# Patient Record
Sex: Female | Born: 1980 | Race: White | Hispanic: No | Marital: Married | Smoking: Never smoker
Health system: Southern US, Community
[De-identification: ages and names within clinical notes are randomized; demographics above are authoritative.]

## PROBLEM LIST (undated history)

## (undated) DIAGNOSIS — I639 Cerebral infarction, unspecified: Secondary | ICD-10-CM

## (undated) DIAGNOSIS — G459 Transient cerebral ischemic attack, unspecified: Secondary | ICD-10-CM

## (undated) DIAGNOSIS — F419 Anxiety disorder, unspecified: Secondary | ICD-10-CM

## (undated) HISTORY — PX: DILATION AND CURETTAGE OF UTERUS: SHX78

## (undated) HISTORY — DX: Anxiety disorder, unspecified: F41.9

---

## 1998-06-01 ENCOUNTER — Encounter: Admission: RE | Admit: 1998-06-01 | Discharge: 1998-06-01 | Payer: Self-pay | Admitting: Family Medicine

## 1998-09-15 ENCOUNTER — Encounter: Admission: RE | Admit: 1998-09-15 | Discharge: 1998-09-15 | Payer: Self-pay | Admitting: Family Medicine

## 1999-07-12 ENCOUNTER — Encounter: Payer: Self-pay | Admitting: Sports Medicine

## 1999-07-12 ENCOUNTER — Encounter: Admission: RE | Admit: 1999-07-12 | Discharge: 1999-07-12 | Payer: Self-pay | Admitting: Sports Medicine

## 1999-08-11 ENCOUNTER — Encounter: Admission: RE | Admit: 1999-08-11 | Discharge: 1999-08-11 | Payer: Self-pay | Admitting: Family Medicine

## 1999-11-09 ENCOUNTER — Encounter: Admission: RE | Admit: 1999-11-09 | Discharge: 1999-11-09 | Payer: Self-pay | Admitting: Family Medicine

## 1999-11-16 ENCOUNTER — Inpatient Hospital Stay (HOSPITAL_COMMUNITY): Admission: EM | Admit: 1999-11-16 | Discharge: 1999-11-16 | Payer: Self-pay | Admitting: Obstetrics and Gynecology

## 2001-02-22 ENCOUNTER — Other Ambulatory Visit: Admission: RE | Admit: 2001-02-22 | Discharge: 2001-02-22 | Payer: Self-pay | Admitting: Obstetrics and Gynecology

## 2001-02-24 ENCOUNTER — Encounter: Payer: Self-pay | Admitting: Obstetrics and Gynecology

## 2001-02-25 ENCOUNTER — Ambulatory Visit (HOSPITAL_COMMUNITY): Admission: AD | Admit: 2001-02-25 | Discharge: 2001-02-25 | Payer: Self-pay | Admitting: Obstetrics and Gynecology

## 2001-02-25 ENCOUNTER — Encounter (INDEPENDENT_AMBULATORY_CARE_PROVIDER_SITE_OTHER): Payer: Self-pay | Admitting: Specialist

## 2001-02-28 ENCOUNTER — Ambulatory Visit: Admission: AD | Admit: 2001-02-28 | Discharge: 2001-02-28 | Payer: Self-pay | Admitting: Obstetrics and Gynecology

## 2001-02-28 ENCOUNTER — Encounter (INDEPENDENT_AMBULATORY_CARE_PROVIDER_SITE_OTHER): Payer: Self-pay | Admitting: Specialist

## 2001-06-10 ENCOUNTER — Encounter: Admission: RE | Admit: 2001-06-10 | Discharge: 2001-06-10 | Payer: Self-pay | Admitting: Family Medicine

## 2001-09-25 ENCOUNTER — Encounter: Admission: RE | Admit: 2001-09-25 | Discharge: 2001-09-25 | Payer: Self-pay | Admitting: Family Medicine

## 2002-01-14 ENCOUNTER — Other Ambulatory Visit: Admission: RE | Admit: 2002-01-14 | Discharge: 2002-01-14 | Payer: Self-pay | Admitting: Obstetrics and Gynecology

## 2002-06-18 ENCOUNTER — Inpatient Hospital Stay (HOSPITAL_COMMUNITY): Admission: AD | Admit: 2002-06-18 | Discharge: 2002-06-18 | Payer: Self-pay | Admitting: Obstetrics and Gynecology

## 2002-06-18 ENCOUNTER — Encounter: Payer: Self-pay | Admitting: Obstetrics and Gynecology

## 2002-07-11 ENCOUNTER — Inpatient Hospital Stay (HOSPITAL_COMMUNITY): Admission: AD | Admit: 2002-07-11 | Discharge: 2002-07-11 | Payer: Self-pay | Admitting: Obstetrics and Gynecology

## 2002-07-20 ENCOUNTER — Inpatient Hospital Stay (HOSPITAL_COMMUNITY): Admission: AD | Admit: 2002-07-20 | Discharge: 2002-07-20 | Payer: Self-pay | Admitting: Obstetrics and Gynecology

## 2002-07-21 ENCOUNTER — Inpatient Hospital Stay (HOSPITAL_COMMUNITY): Admission: AD | Admit: 2002-07-21 | Discharge: 2002-07-24 | Payer: Self-pay | Admitting: Obstetrics and Gynecology

## 2002-07-22 ENCOUNTER — Encounter (INDEPENDENT_AMBULATORY_CARE_PROVIDER_SITE_OTHER): Payer: Self-pay

## 2002-09-05 ENCOUNTER — Other Ambulatory Visit: Admission: RE | Admit: 2002-09-05 | Discharge: 2002-09-05 | Payer: Self-pay | Admitting: Obstetrics and Gynecology

## 2003-09-23 ENCOUNTER — Other Ambulatory Visit: Admission: RE | Admit: 2003-09-23 | Discharge: 2003-09-23 | Payer: Self-pay | Admitting: Obstetrics and Gynecology

## 2004-10-19 ENCOUNTER — Other Ambulatory Visit: Admission: RE | Admit: 2004-10-19 | Discharge: 2004-10-19 | Payer: Self-pay | Admitting: Obstetrics and Gynecology

## 2006-01-19 ENCOUNTER — Ambulatory Visit (HOSPITAL_COMMUNITY): Admission: RE | Admit: 2006-01-19 | Discharge: 2006-01-19 | Payer: Self-pay | Admitting: Obstetrics and Gynecology

## 2010-02-07 ENCOUNTER — Emergency Department (HOSPITAL_COMMUNITY): Admission: EM | Admit: 2010-02-07 | Discharge: 2010-02-07 | Payer: Self-pay | Admitting: Emergency Medicine

## 2010-02-07 DIAGNOSIS — G459 Transient cerebral ischemic attack, unspecified: Secondary | ICD-10-CM

## 2010-02-07 HISTORY — DX: Transient cerebral ischemic attack, unspecified: G45.9

## 2010-02-09 ENCOUNTER — Encounter: Admission: RE | Admit: 2010-02-09 | Discharge: 2010-02-09 | Payer: Self-pay | Admitting: Family Medicine

## 2010-08-21 ENCOUNTER — Encounter: Payer: Self-pay | Admitting: Obstetrics and Gynecology

## 2010-10-16 LAB — CBC
HCT: 37.6 % (ref 36.0–46.0)
MCHC: 34 g/dL (ref 30.0–36.0)
MCV: 82.2 fL (ref 78.0–100.0)
Platelets: 342 10*3/uL (ref 150–400)
RDW: 16.5 % — ABNORMAL HIGH (ref 11.5–15.5)

## 2010-10-16 LAB — BASIC METABOLIC PANEL
BUN: 10 mg/dL (ref 6–23)
Chloride: 107 mEq/L (ref 96–112)
Creatinine, Ser: 0.62 mg/dL (ref 0.4–1.2)
Glucose, Bld: 113 mg/dL — ABNORMAL HIGH (ref 70–99)
Potassium: 3.9 mEq/L (ref 3.5–5.1)

## 2010-10-16 LAB — DIFFERENTIAL
Basophils Absolute: 0 10*3/uL (ref 0.0–0.1)
Basophils Relative: 0 % (ref 0–1)
Eosinophils Absolute: 0 10*3/uL (ref 0.0–0.7)
Eosinophils Relative: 0 % (ref 0–5)
Monocytes Absolute: 0.4 10*3/uL (ref 0.1–1.0)

## 2010-12-16 NOTE — Discharge Summary (Signed)
NAME:  [name], [name]                        ACCOUNT NO.:  [id number]   MEDICAL RECORD NO.:  [id number]                   PATIENT TYPE:  INP   LOCATION:  9119                                 FACILITY:  [facility]   PHYSICIAN:  Tracie Harrier, M.D.              DATE OF BIRTH:  08/03/1980   DATE OF ADMISSION:  07/21/2002  DATE OF DISCHARGE:  07/24/2002                                 DISCHARGE SUMMARY   ADMITTING DIAGNOSES:  1. Intrauterine pregnancy at 24 weeks estimated gestational age.  2. Pregnancy-induced hypertension.   DISCHARGE DIAGNOSES:  1. Status post low transverse cesarean section.  2. Viable female infant.   PROCEDURE:  Primary low transverse cesarean section.   REASON FOR ADMISSION:  Please see written H&P.   HOSPITAL COURSE:  The patient is a 30 year old white female gravida 2 para 0  at 81 weeks estimated gestational age who was admitted to [facility] for a two-stage induction for mild proteinuria and elevated blood  pressure.  PIH labs were within normal limits.  The patient was given  Cytotec on the evening of admission.  On the following a.m., Pitocin was  started.  She was noted to have decelerations on the monitor to the 80s,  lasting two to three minutes, which resolved with position change and oxygen  administration.  Artificial rupture of membranes was performed revealing  clear fluid.  Intrauterine pressure catheter and fetal scalp electrode were  placed.  Fetal heart tones continued to be reactive with intermittent sharp  decelerations.  Amnioinfusion was instituted.  The baby continued to have  decelerations to the 80s.  Cervix was noted to be 3 cm dilated with a vertex  presentation at a -2 station.  Decision was made to proceed with a cesarean  delivery.  The patient was transferred to the operating room where epidural  anesthesia was augmented to a surgical level.  A low transverse incision was  made with the delivery of a viable female  infant weighing 8 pounds 0 ounces  with Apgars of 9 at one minute, 9 at five minutes.  Arterial cord pH was  7.30.  The patient tolerated the procedure well and was taken to the  recovery room in stable condition.  On postoperative day #1 the patient had  good return of bowel function.  Abdomen was soft and nontender.  Abdominal  dressing was clean, dry, and intact.  The patient was tolerating a regular  diet without complaints of nausea and vomiting.  On postoperative day #2 the  patient was doing well.  She was ambulating without assistance.  Abdomen was  soft.  Fundus was firm and nontender.  Incision was clean, dry, and intact.  Labs revealed hemoglobin of 9.6; platelet count of 245,000; wbc count of  13.0.  The patient was discharged home.   CONDITION ON DISCHARGE:  Good.   DIET:  Regular as tolerated.  ACTIVITY:  No heavy lifting, no driving x2 weeks, no vaginal entry.   FOLLOW-UP:  She is to follow up in the office in two to three days for  staple removal, then she is to return to the office in one to two weeks for  an incision check.  She is to call for a temperature greater than 100  degrees, persistent nausea and vomiting, heavy vaginal bleeding, and/or  redness or drainage from the incisional site.    DISCHARGE MEDICATIONS:  1. Percocet 5/325 #30 one p.o. q.4-6h. p.r.n. pain.  2. Motrin 600 mg q.6h. p.r.n.  3. Prenatal vitamins one p.o. daily.  4. Colace one p.o. daily p.r.n.     Julio Sicks, N.P.                        Tracie Harrier, M.D.    CC/MEDQ  D:  08/27/2002  T:  08/27/2002  Job:  [id number]

## 2010-12-16 NOTE — Op Note (Signed)
NAME:  [name], [name]                        ACCOUNT NO.:  [id number]   MEDICAL RECORD NO.:  [id number]                   PATIENT TYPE:  INP   LOCATION:  9119                                 FACILITY:  [facility]   PHYSICIAN:  Guy Sandifer. Arleta Creek, M.D.           DATE OF BIRTH:  [birth year]   DATE OF PROCEDURE:  07/22/2002  DATE OF DISCHARGE:                                 OPERATIVE REPORT   PREOPERATIVE DIAGNOSES:  1. Intrauterine pregnancy at 59 weeks estimated gestational age.  2. Nonreassuring fetal heart tracing.   POSTOPERATIVE DIAGNOSES:  1. Intrauterine pregnancy at 61 weeks estimated gestational age.  2. Nonreassuring fetal heart tracing.   PROCEDURE:  Low transverse cesarean section.   SURGEON:  Guy Sandifer. Henderson Cloud, M.D.   ANESTHESIA:  Epidural, Quillian Quince, M.D.   ESTIMATED BLOOD LOSS:  1000 cc.   FINDINGS:  Viable female infant.  Apgars of 9 and 9 at one and five minutes,  respectively.  Arterial cord pH 7.30.  Birth weight 8 pounds 0 ounces.   SPECIMENS:  Placenta.   INDICATIONS AND CONSENT:  This patient is a 30 year old white female G2, P0  with an EDC of July 26, 2002 who was admitted to the hospital for  induction yesterday secondary to a mild proteinuria and blood pressure  elevations with normal PIH laboratories as well as prolonged prodromal  labor.  The patient was given Cytotec followed by Pitocin this morning.  She  was noted to have a deceleration to the 80s lasting two or three minutes  which resolved with position change and oxygen.  Artificial rupture of  membranes was carried out for clear fluid.  IUPC and fetal scalp electrode  was placed.  Fetal heart tones continued to be reactive with intermittent  sharp decelerations.  Amnioinfusion was instituted.  Baby had a deceleration  down to the 80s or so which lasted for two or three minutes and resolved.  She then continued to have intermittent decelerations.  Cervix was 3 cm, -2  station.  Due to  the continued periods of fetal heart rate deceleration and  anticipation of a long labor, cesarean section was recommended.  Potential  risks and complications were discussed with the patient and her husband  preoperatively including, but not limited to, infection, organ damage,  bleeding requiring transfusion of blood products with possible HIV and  hepatitis acquisition, DVT, PE, and pneumonia.  All questions were answered  and consent is signed on the chart.   PROCEDURE:  The patient is taken to the operating room where epidural is  augmented to a surgical level.  She is prepped and draped in the sterile  fashion.  Foley catheter is already in place.  After testing for adequate  epidural anesthesia, skin is entered through a Pfannenstiel incision and  dissection is carried out in layers to the peritoneum.  Peritoneum is  incised and extended superiorly and inferiorly.  The vesicouterine  peritoneum is taken down cephalolaterally.  The bladder flap is developed  and the bladder blade was placed.  Uterus is incised in a low transverse  manner and the uterine cavity is entered bluntly with a hemostat.  The  uterine incision is extended cephalolaterally with the fingers.  Vertex is  delivered and noted to be in the occiput posterior position.  Nuchal cord x2  was reduced.  Oro and nasopharynx were suctioned.  The remainder of the  infant is delivered.  Good cry and tone is noted.  Infant is handed to the  waiting pediatrics team.  Placenta is manually delivered, sent to pathology.  The internal uterine contour is normal and clean.  Uterus is somewhat boggy  at first, but responds to 40 units of Pitocin in the initial bag of IV  fluids.  Uterus is closed in a running locking fashion with 0 Monocryl  suture which achieves good hemostasis.  Tubes and ovaries are normal  bilaterally.  Anterior peritoneum is closed in a running fashion with 0  Monocryl suture which is also used to reapproximate  the pyramidalis muscle  in the midline.  Anterior rectus fascia is closed in a running fashion with  0 PDS suture and the skin is closed with clips.  All sponge, instrument, and  needle counts are correct and the patient is transferred to the recovery  room in stable condition.                                               Guy Sandifer Arleta Creek, M.D.    JET/MEDQ  D:  07/22/2002  T:  07/22/2002  Job:  [id number]

## 2010-12-16 NOTE — Op Note (Signed)
[facility]  Patient:    [name], [name]                     MRN: [id number] Proc. Date: 02/28/01 Adm. Date:  [id number] Attending:  Cordelia Pen Ii                           Operative Report  PREOPERATIVE DIAGNOSIS:       Hematometra status post dilatation and evacuation on February 25, 2001.  POSTOPERATIVE DIAGNOSIS:      Hematometra status post dilatation and evacuation on February 25, 2001.  OPERATION:                    Dilatation and evacuation.  SURGEON:                      Nira Retort, P.A.  ANESTHESIA:                   MAC with 1% Xylocaine paracervical block.  ANESTHESIOLOGIST:             Raul Del, M.D.  ESTIMATED BLOOD LOSS:         Less than 100 cc.  INDICATIONS AND CONSENT:      The patient is a 30 year old married white female, G1, SAB1 status post D&E for a miscarriage on February 25, 2001.  She has had intermittent bleeding and cramping since that time.  Today she had markedly increased pain with heavy pelvic cramping.  She has also had increasing bleeding, passing large clots.  No fever, no nausea or vomiting. She had a normal bowel movement today.  Evaluation in triage revealed temperature to be 99.1 with stable vital signs.  Cervix was fingertip dilated, and the uterus was 8 to 10 weeks in size.  The adnexa were without masses. Hemoglobin is 11.6, and white count is 10.9.  Blood type is A positive. Diagnosis of hematometra was made.  Recommendation for dilatation and evacuation was discussed with the patient and her husband.  Potential risks and complications were discussed including but not limited to infection, uterine perforation, organ damage, bleeding requiring transfusion of blood products with possible transfusion reaction, HIV and hepatitis acquisition, DVT, PE, pneumonia, hysterectomy, and synechia formation.  All questions were answered and consent is signed and on the chart.  DESCRIPTION OF  PROCEDURE:     The patient is taken to the operating room and placed in the dorsolithotomy position where IV anesthetic is given.  She is then placed in the dorsolithotomy position where she is prepped, bladder straight catheterized,  and she is draped in a sterile fashion.  Bivalve speculum is placed in the vagina, and anterior cervical lip is injected with 1% Xylocaine.  The cervix is then grasped with a single-tooth tenaculum. Paracervical block is then placed at 2, 4, 5, 7, 8, and 10 oclock positions with 1% Xylocaine.  The cervix is gently dilated to a 33 Pratt dilator.  A #7 curved curet is then placed in the uterine cavity, and suction curettage is carried out.   Alternating sharp and suction curettage is carried out until the cavity is clean.  Excellent hemostasis is noted.  Ten units of Pitocin are added to the remaining 500 cc of IV fluids after initial pass of the curet. The patient is given IV clindamycin during the case.  Blood type is A positive.  All  counts are correct.  The patient is awakened and taken to the recovery room in stable condition.  She will be discharged home on Methergine 0.9 mg p.o. t.i.d. for 3 days, a Zithromax pack, and follow up in the office in four to five days. DD:  02/28/01 TD:  03/01/01 Job: [id number]

## 2010-12-16 NOTE — Op Note (Signed)
[facility]  Patient:    [name], [name]                       MRN: [id number] Proc. Date: 02/25/01 Attending:  Freddy Finner, M.D.                           Operative Report  SS#:                          [id number]  PREOPERATIVE DIAGNOSIS:       Missed abortion.  POSTOPERATIVE DIAGNOSIS:      Missed abortion.  OPERATION:                    Dilatation and evacuation.  SURGEON:                      Freddy Finner, M.D.  ANESTHESIA:                   Managed intravenous sedation and paracervical                               block.  INTRAOPERATIVE COMPLICATIONS: None.  ESTIMATED INTRAOPERATIVE BLOOD LOSS:  Less than or equal to 50 cc.  INDICATIONS:                  The patient is a 30 year old married white female primigravida who was seen in the office last week and found to have a nonviable intrauterine pregnancy by pelvic ultrasound.  She is admitted now for a D&E.  She was admitted on the morning of surgery.  Her blood type is known to be A-positive.  DESCRIPTION OF PROCEDURE:     She was brought to the operating room and placed under adequate intravenous sedation.  She was placed in the dorsal lithotomy position.  A Betadine prep of the perineum and the vagina was carried out.  A bivalved speculum was introduced.  A paracervical block was placed using 10 cc of 1% Xylocaine.  The cervix was progressively dilated to 27 with Pratts.  An 8 mm suction cannula was introduced, and a vacuum aspiration confirmed products of conception.  This was continued until it was felt that the cavity was evacuated.  A gentle sharp curettage and expiration with Randall stone forceps and repeat vacuum aspiration confirmed the complete evacuation of the cavity.  The procedure was terminated.  The patient was taken to the recovery room in good condition.  DISPOSITION:                  She will be given routine outpatient surgical instructions, and will be  followed up in approximately one week in the office. DD:  02/25/01 TD:  02/25/01 Job: [id number]

## 2010-12-19 ENCOUNTER — Inpatient Hospital Stay (HOSPITAL_COMMUNITY)
Admission: AD | Admit: 2010-12-19 | Discharge: 2010-12-21 | DRG: 371 | Disposition: A | Discharge status: Home / Self Care | Payer: BC Managed Care – PPO | Source: Ambulatory Visit | Attending: Obstetrics and Gynecology | Admitting: Obstetrics and Gynecology

## 2010-12-19 DIAGNOSIS — R21 Rash and other nonspecific skin eruption: Secondary | ICD-10-CM | POA: Diagnosis not present

## 2010-12-19 DIAGNOSIS — O99892 Other specified diseases and conditions complicating childbirth: Secondary | ICD-10-CM | POA: Diagnosis present

## 2010-12-19 DIAGNOSIS — Z22330 Carrier of Group B streptococcus: Secondary | ICD-10-CM

## 2010-12-19 DIAGNOSIS — O34219 Maternal care for unspecified type scar from previous cesarean delivery: Principal | ICD-10-CM | POA: Diagnosis present

## 2010-12-19 DIAGNOSIS — O99893 Other specified diseases and conditions complicating puerperium: Secondary | ICD-10-CM | POA: Diagnosis not present

## 2010-12-19 LAB — URINALYSIS, ROUTINE W REFLEX MICROSCOPIC
Nitrite: NEGATIVE
Specific Gravity, Urine: 1.02 (ref 1.005–1.030)
Urobilinogen, UA: 0.2 mg/dL (ref 0.0–1.0)
pH: 6.5 (ref 5.0–8.0)

## 2010-12-19 LAB — COMPREHENSIVE METABOLIC PANEL
ALT: 16 U/L (ref 0–35)
Albumin: 2.6 g/dL — ABNORMAL LOW (ref 3.5–5.2)
Calcium: 9.7 mg/dL (ref 8.4–10.5)
GFR calc Af Amer: >60 mL/min (ref >60)
Glucose, Bld: 103 mg/dL — ABNORMAL HIGH (ref 70–99)
Sodium: 131 mEq/L — ABNORMAL LOW (ref 135–145)
Total Protein: 6.3 g/dL (ref 6.0–8.3)

## 2010-12-19 LAB — RPR: RPR Ser Ql: NONREACTIVE

## 2010-12-19 LAB — CBC
MCHC: 33.8 g/dL (ref 30.0–36.0)
Platelets: 232 10*3/uL (ref 150–400)
RDW: 13.7 % (ref 11.5–15.5)
WBC: 10.2 10*3/uL (ref 4.0–10.5)

## 2010-12-19 LAB — URINE MICROSCOPIC-ADD ON

## 2010-12-19 LAB — LACTATE DEHYDROGENASE: LDH: 183 U/L (ref 94–250)

## 2010-12-20 LAB — CBC
HCT: 35.2 % — ABNORMAL LOW (ref 36.0–46.0)
MCHC: 32.4 g/dL (ref 30.0–36.0)
MCV: 90 fL (ref 78.0–100.0)
RDW: 14 % (ref 11.5–15.5)

## 2010-12-30 ENCOUNTER — Other Ambulatory Visit (HOSPITAL_COMMUNITY): Payer: BC Managed Care – PPO

## 2010-12-30 ENCOUNTER — Inpatient Hospital Stay (HOSPITAL_COMMUNITY): Admission: AD | Admit: 2010-12-30 | Payer: Self-pay | Admitting: Obstetrics and Gynecology

## 2010-12-30 NOTE — Discharge Summary (Signed)
[name], [name] NO.:  [id number]  MEDICAL RECORD NO.:  [id number]           PATIENT TYPE:  I  LOCATION:  9101                          FACILITY:  [facility]  PHYSICIAN:  Lenoard Aden, M.D.DATE OF BIRTH:  1981-04-16  DATE OF ADMISSION:  12/19/2010 DATE OF DISCHARGE:  12/21/2010                              DISCHARGE SUMMARY   ADMITTING DIAGNOSES:  A [redacted] weeks gestation, spontaneous rupture of membranes, previous cesarean section, desires repeat.  DISCHARGE DIAGNOSES:  Postoperative day #2; status post cesarean section, allergic reaction with dermatitis.  HISTORY:  The patient is a 30 year old gravida 3, para 1-0-0-1 at [redacted] weeks gestation with an [facility] of January 09, 2011.  Prenatal care at [facility] since 8 weeks with Dr. Billy Coast as primary.  PRENATAL LABORATORY DATA:  Blood type and Rh is A positive, antibody negative, rubella positive, GBS positive, HIV negative, RPR negative, hepatitis B negative and a normal GTT screening.  Prenatal course was complicated by history of cesarean section with gestational hypertension. No evidence of gestational hypertension in this pregnancy.  MEDICAL SURGICAL HISTORY:  Significant for cesarean section in 2003 for gestational hypertension and failure to progress in labor.  ALLERGY:  PENICILLIN with hives, ERYTHROMYCIN with GI upset.  CURRENT MEDICATIONS:  Prenatal vitamin 1 tablet daily and Zofran as needed for nausea.  PRESENTATION:  The patient presents at [redacted] weeks gestation with spontaneous rupture of membranes and onset of labor.  The patient declines trial of labor after cesarean section and elects for repeat C- section.  Admission CBC, white blood cell count 10.2, hemoglobin 12.6, hematocrit 37.3, and a platelet count 232.  Uric acid of 3.8, SGOT and SGPT are within normal limits.  Normal PIH screening.  PROCEDURE:  The patient undergoes cesarean section by Dr. Billy Coast with a delivery of a female, Apgar scores of 8 and  9, newborn is transferred to the regular nursery.  Postoperative care.    POSTOPERATIVE COURSE:  The patient's postoperative course was complicated by mild-moderate allergic reaction presumed medication in etiology. Patient has no SOB, only dermatitis reaction. The patient received Ancef in the OR perioperatively and Percocet postpartum.  Onset of rash on postop day #1 with increasing rash on postoperative day 2. Patient reports increase in symptoms with each dose of Percocet, unknown agent of etiology for allergic reaction so Percocet discontinued. The patient  initiated on therapy for medication reaction with benadryl followed by Atarax, zantac 150 mg daily. Postoperative CBC, white blood cell count 9.9, hemoglobin 11.4, hematocrit 35.2, and a platelet count 225.  Vital signs were stable.  The patient remains afebrile during the hospitalization.  Blood pressures stable and no evidence of preeclampsia.  No symptoms of PIH.  Physical exam notes marked erythema with enlarging rash. Rash initiated on abdomen, periumbilical with generalized pruritus and expansion of rash on postoperative day 2 to two-thirds of the abdomen, buttocks, back, and thigh.  The wound edges are well approximated with staples.  There is no erythema, no ecchymosis or drainage from the wound sites, only erythema related to the rash.  The patient is discharged home on postoperative day #2 in  stable condition.  DISCHARGE INSTRUCTIONS:  Instructions per [facility] booklet.  ACTIVITY:  Postoperative restrictions x2 weeks.  DIET:  Regular.  Incision is intact with staples with removal on Dec 23, 2010, at [facility].  MEDICATIONS AT THE TIME OF DISCHARGE: 1. Prenatal vitamin 1 tablet daily. 2. Ibuprofen 800 mg every 8 hours as needed for discomfort. 3. Ultram 50 mg p.o. q.6 h. as needed for pain. 4. Zantac 150 mg p.o. daily. 5. Atarax 25 mg q.6 h. as needed for itching and irritation. 6. Triamcinolone 0.1% steroid cream applied to  rash twice daily as     needed.  Reevaluation of allergic response at postoperative visit on Dec 23, 2010, with staple removal with consideration of a Medrol pack if any worsening in her condition.     Marlinda Mike, C.N.M.   ______________________________ Lenoard Aden, M.D.    TB/MEDQ  D:  12/22/2010  T:  12/23/2010  Job:  [id number]  Electronically Signed by Marlinda Mike C.N.M. on 12/27/2010 11:05:57 AM Electronically Signed by Olivia Mackie M.D. on 12/30/2010 02:01:53 PM

## 2010-12-30 NOTE — Op Note (Signed)
  [name], [name] NO.:  [id number]  MEDICAL RECORD NO.:  [id number]           PATIENT TYPE:  I  LOCATION:  9101                          FACILITY:  [facility]  PHYSICIAN:  Lenoard Aden, M.D.DATE OF BIRTH:  10/10/1980  DATE OF PROCEDURE:  12/19/2010 DATE OF DISCHARGE:                              OPERATIVE REPORT   PREOPERATIVE DIAGNOSES: 1. Previous C-section. 2. A 37 weeks with spontaneous rupture of membranes. 3. GBS positive.  POSTOPERATIVE DIAGNOSES: 1. Previous C-section. 2. A 37 weeks with spontaneous rupture of membranes. 3. GBS positive.  PROCEDURE:  Repeat low segment transverse cesarean section.  SURGEON:  Lenoard Aden, MD  ASSISTANT:  Marlinda Mike, CNM  ANESTHESIA:  Spinal by Dr. Arby Barrette.  ESTIMATED BLOOD LOSS:  1000 mL.  COMPLICATIONS:  None.  DRAINS:  Foley.  COUNTS:  Correct.  DISPOSITION:  To recovery in good condition.  FINDINGS:  Full-term living female, occiput anterior positioning, Apgar's of 8 and 9.  Pediatricians attendance.  Normal tubes, normal ovaries.  Normal uterine cavity.  Two-layer uterine closure.  BRIEF OPERATIVE NOTE:  Noted to being apprised of risks of anesthesia, infection, bleeding, injury to abdominal organs, need for repair, delayed versus immediate complications to include bowel and bladder injury, possible need for repair, the patient was to brought to the operating room and administered spinal anesthetic without complications, prepped and draped in usual sterile fashion.  Foley catheter was placed after achieving adequate anesthesia, dilute Marcaine solution was placed.  Pfannenstiel skin incision was made with scalpel, carried down to fascia which was nicked in midline and opened transversely using Mayo scissors.  Rectus muscles were dissected sharply in the midline. Peritoneum was entered sharply.  Bladder blade was placed.  Visceral peritoneum scored sharply off the lower uterine segment.   Kerr hysterotomy incision was made.  Atraumatic delivery full-term living female handed to pediatricians in attendance.  Apgar's of 8 and 9.  Cord blood collected.  Placenta delivered manually intact, three-vessel cord. Uterus exteriorized, curetted using a dry lap pack and closed in 2 running imbricating layers of 0 Monocryl suture and interrupted sutures to the right lateral and midline are placed for further hemostasis. Bladder flap inspected and found to be hemostatic.  Irrigation accomplished.  Uterus was placed in the abdominal cavity and reinspected.  Urine output was excellent.  Urine was clear.  After achieving good hemostasis, the fascia was reapproximated using 0 Monocryl in running fashion.  Subcutaneous tissue reapproximated using a 2-0 plain in a continued running fashion.  Skin was closed using staples.  The patient tolerated the procedure well and was transferred to recovery in good condition.     Lenoard Aden, M.D.     RJT/MEDQ  D:  12/19/2010  T:  12/19/2010  Job:  [id number]  Electronically Signed by Olivia Mackie M.D. on 12/30/2010 02:01:51 PM

## 2011-01-05 ENCOUNTER — Inpatient Hospital Stay (HOSPITAL_COMMUNITY)
Admission: RE | Admit: 2011-01-05 | Payer: BC Managed Care – PPO | Source: Ambulatory Visit | Admitting: Obstetrics and Gynecology

## 2011-08-01 DIAGNOSIS — F419 Anxiety disorder, unspecified: Secondary | ICD-10-CM

## 2011-08-01 HISTORY — DX: Anxiety disorder, unspecified: F41.9

## 2014-01-08 ENCOUNTER — Encounter (HOSPITAL_COMMUNITY): Payer: Self-pay | Admitting: Emergency Medicine

## 2014-01-08 DIAGNOSIS — R42 Dizziness and giddiness: Secondary | ICD-10-CM | POA: Insufficient documentation

## 2014-01-08 DIAGNOSIS — R209 Unspecified disturbances of skin sensation: Secondary | ICD-10-CM | POA: Insufficient documentation

## 2014-01-08 DIAGNOSIS — Z8673 Personal history of transient ischemic attack (TIA), and cerebral infarction without residual deficits: Secondary | ICD-10-CM | POA: Insufficient documentation

## 2014-01-08 DIAGNOSIS — Z880 Allergy status to penicillin: Secondary | ICD-10-CM | POA: Insufficient documentation

## 2014-01-08 NOTE — ED Notes (Signed)
The patient said she felt "swimmy headed" and her left arm felt numb.  Everything has resolved in the drive here but she still wanted to get checked out.  The patient and her husband denies any slurred speech, drooping of the face or any other symptoms.  The numbness and the "swimmy headedness" resolved on the drive here.  The patient was diagnosed with a TIA on 02/07/2010.  She has had several tests done but nothing has shown.  She is complaining of neck pain 3/10.

## 2014-01-09 ENCOUNTER — Emergency Department (HOSPITAL_COMMUNITY)
Admission: EM | Admit: 2014-01-09 | Discharge: 2014-01-09 | Disposition: A | Discharge status: Home / Self Care | Payer: 59 | Attending: Emergency Medicine | Admitting: Emergency Medicine

## 2014-01-09 DIAGNOSIS — R202 Paresthesia of skin: Secondary | ICD-10-CM

## 2014-01-09 HISTORY — DX: Transient cerebral ischemic attack, unspecified: G45.9

## 2014-01-09 NOTE — Discharge Instructions (Signed)
Hyperventilation Hyperventilation is breathing that is deeper and more rapid than normal. It is usually associated with panic and anxiety. Hyperventilation can make you feel breathless. It is sometimes called overbreathing. Breathing out too much causes a decrease in the amount of carbon dioxide gas in the blood. This leads to tingling and numbness in the hands, feet, and around the mouth. If this continues, your fingers, hands, and toes may begin to spasm. Hyperventilation usually lasts 20 30 minutes and can be associated with other symptoms of panic and anxiety, including:   Chest pains or tightness.  A pounding or irregular, racing heartbeat (palpitations).  Dizziness.  Lightheadedness.  Dry mouth.  Weakness.  Confusion.  Sleep disturbance. CAUSES  Sudden onset (acute) hyperventilation is usually triggered by acute stress, anxiety, or emotional upset. Long-term (chronic) and recurring hyperventilation can occur with chronic lung problems, such emphysema or asthma. Other causes include:   Nervousness.  Stress.  Stimulant, drug, or alcohol use.  Lung disease.  Infections, such as pneumonia.  Heart problems.  Severe pain.  Waking from a bad dream.  Pregnancy.  Bleeding. HOME CARE INSTRUCTIONS  Learn and use breathing exercises that help you breathe from your diaphragm and abdomen.  Practice relaxation techniques to reduce stress, such as visualization, meditation, and muscle release.  During an attack, try breathing into a paper bag. This changes the carbon dioxide level and slows down breathing. SEEK IMMEDIATE MEDICAL CARE IF:  Your hyperventilation continues or gets worse. MAKE SURE YOU:  Understand these instructions.  Will watch your condition.  Will get help right away if you are not doing well or get worse. Document Released: 07/14/2000 Document Revised: 01/16/2012 Document Reviewed: 10/26/2011 [facility] Patient Information 2014 [place],  [place].  Paresthesia Paresthesia is an abnormal burning or prickling sensation. This sensation is generally felt in the hands, arms, legs, or feet. However, it may occur in any part of the body. It is usually not painful. The feeling may be described as:  Tingling or numbness.  "Pins and needles."  Skin crawling.  Buzzing.  Limbs "falling asleep."  Itching. Most people experience temporary (transient) paresthesia at some time in their lives. CAUSES  Paresthesia may occur when you breathe too quickly (hyperventilation). It can also occur without any apparent cause. Commonly, paresthesia occurs when pressure is placed on a nerve. The feeling quickly goes away once the pressure is removed. For some people, however, paresthesia is a long-lasting (chronic) condition caused by an underlying disorder. The underlying disorder may be:  A traumatic, direct injury to nerves. Examples include a:  Broken (fractured) neck.  Fractured skull.  A disorder affecting the brain and spinal cord (central nervous system). Examples include:  Transverse myelitis.  Encephalitis.  Transient ischemic attack.  Multiple sclerosis.  Stroke.  Tumor or blood vessel problems, such as an arteriovenous malformation pressing against the brain or spinal cord.  A condition that damages the peripheral nerves (peripheral neuropathy). Peripheral nerves are not part of the brain and spinal cord. These conditions include:  Diabetes.  Peripheral vascular disease.  Nerve entrapment syndromes, such as carpal tunnel syndrome.  Shingles.  Hypothyroidism.  Vitamin B12 deficiencies.  Alcoholism.  Heavy metal poisoning (lead, arsenic).  Rheumatoid arthritis.  Systemic lupus erythematosus. DIAGNOSIS  Your caregiver will attempt to find the underlying cause of your paresthesia. Your caregiver may:  Take your medical history.  Perform a physical exam.  Order various lab tests.  Order imaging  tests. TREATMENT  Treatment for paresthesia depends on the underlying  cause. HOME CARE INSTRUCTIONS  Avoid drinking alcohol.  You may consider massage or acupuncture to help relieve your symptoms.  Keep all follow-up appointments as directed by your caregiver. SEEK IMMEDIATE MEDICAL CARE IF:   You feel weak.  You have trouble walking or moving.  You have problems with speech or vision.  You feel confused.  You cannot control your bladder or bowel movements.  You feel numbness after an injury.  You faint.  Your burning or prickling feeling gets worse when walking.  You have pain, cramps, or dizziness.  You develop a rash. MAKE SURE YOU:  Understand these instructions.  Will watch your condition.  Will get help right away if you are not doing well or get worse. Document Released: 07/07/2002 Document Revised: 10/09/2011 Document Reviewed: 04/07/2011 [facility] Patient Information 2014 [place], [place].

## 2014-01-09 NOTE — ED Notes (Signed)
Pt not requesting a wheelchair at discharge. Pt ambulated to exit by RN.

## 2014-01-09 NOTE — ED Notes (Signed)
Dr. Preston FleetingGlick in to room. [facility] Pt denies: neck pain, R scapular pain, L FA "alterred sensation/numness", or "swimmy headedness" recently experienced. H/o TIA with full work up by [facility] Dr. Marjory LiesPenumalli 4 yrs ago.

## 2014-01-09 NOTE — ED Provider Notes (Signed)
CSN: [id number]     Arrival date & time 01/08/14  2045 History   First MD Initiated Contact with Patient 01/09/14 0058     Chief Complaint  Patient presents with  . Numbness    The patient said she felt "swimmy headed" and her left arm felt numb.  Everything has resolved in the drive here but she still wanted to get checked out.     (Consider location/radiation/quality/duration/timing/severity/associated sxs/prior Treatment) The history is provided by the patient.   33 year old female woke up this morning with aching in her neck and lower back. He continued to bother her through the day. While she was driving, she suddenly developed a feeling of STEMI headedness and numbness in her left arm. Symptoms lasted about 20 minutes before resolving spontaneously. There is associated dry mouth but no circumoral numbness and no numbness of the right arm. There is no carpopedal spasm. She denies any headache. She's very concerned because she has a history of TIA 4 years ago and no cause was found. She denies any headache or visual change. There is no ear pain or hearing loss. She has had no symptoms whatsoever since the numbness resolved.  Past Medical History  Diagnosis Date  . TIA (transient ischemic attack) 2011   Past Surgical History  Procedure Laterality Date  . Cesarean section  2003, 2012   History reviewed. No pertinent family history. History  Substance Use Topics  . Smoking status: Never Smoker   . Smokeless tobacco: Never Used  . Alcohol Use: No   OB History   Grav Para Term Preterm Abortions TAB SAB Ect Mult Living                 Review of Systems  All other systems reviewed and are negative.     Allergies  Penicillins; Strawberry; and Watermelon  Home Medications   Prior to Admission medications   Not on File   BP 144/75  Pulse 105  Temp(Src) 97.8 F (36.6 C) (Oral)  Resp 15  Ht 5\' 4"  (1.626 m)  Wt 226 lb 1.6 oz (102.558 kg)  BMI 38.79 kg/m2  SpO2 100%   LMP 12/29/2013 Physical Exam  Nursing note and vitals reviewed.  33 year old female, resting comfortably and in no acute distress. Vital signs are significant for mild tachycardia with heart rate 105. Oxygen saturation is 98%, which is normal. Head is normocephalic and atraumatic. PERRLA, EOMI. Oropharynx is clear. Fundi show no hemorrhage, exudate, or papilledema. Neck is nontender and supple without adenopathy or JVD. There no carotid bruits. Back is nontender and there is no CVA tenderness. Lungs are clear without rales, wheezes, or rhonchi. Chest is nontender. Heart has regular rate and rhythm with 2/6 systolic ejection murmur at the upper left sternal border. Abdomen is soft, flat, nontender without masses or hepatosplenomegaly and peristalsis is normoactive. Extremities have no cyanosis or edema, full range of motion is present. Skin is warm and dry without rash. Neurologic: Mental status is normal, cranial nerves are intact, there are no motor or sensory deficits. There is no pronator drift and there is no extinction on double simultaneous stimulation.  ED Course  Procedures (including critical care time)  EKG Interpretation   Date/Time:  Friday January 09 2014 00:25:02 EDT Ventricular Rate:  107 PR Interval:  171 QRS Duration: 87 QT Interval:  351 QTC Calculation: 468 R Axis:   59 Text Interpretation:  Age not entered, assumed to be  33 years old for  purpose  of ECG interpretation Sinus tachycardia Otherwise within normal  limits No old tracing to compare Confirmed by [facility] PartnershipGLICK  MD, Celsey Asselin ([id number]) on  01/09/2014 12:39:37 AM      MDM   Final diagnoses:  Paresthesia of left arm    Paresthesia of the left arm with associated dry mouth and lightheadedness. Symptoms complex seems most consistent with an episode of hyperventilation. This is explained to the patient. I do not see any indication for additional workup. She has been symptom-free for over 4 hours. She is referred back to  her PCP. Treatment strategies for managing hyperventilation were discussed.    Dione Boozeavid Lashun Ramseyer, MD 01/09/14 0200

## 2014-02-06 ENCOUNTER — Emergency Department (HOSPITAL_COMMUNITY): Payer: 59

## 2014-02-06 ENCOUNTER — Inpatient Hospital Stay (HOSPITAL_COMMUNITY)
Admission: EM | Admit: 2014-02-06 | Discharge: 2014-02-09 | DRG: 065 | Disposition: A | Discharge status: Home / Self Care | Payer: 59 | Attending: Family Medicine | Admitting: Family Medicine

## 2014-02-06 ENCOUNTER — Encounter (HOSPITAL_COMMUNITY): Payer: Self-pay | Admitting: Emergency Medicine

## 2014-02-06 DIAGNOSIS — D509 Iron deficiency anemia, unspecified: Secondary | ICD-10-CM | POA: Diagnosis present

## 2014-02-06 DIAGNOSIS — I6350 Cerebral infarction due to unspecified occlusion or stenosis of unspecified cerebral artery: Secondary | ICD-10-CM

## 2014-02-06 DIAGNOSIS — Z880 Allergy status to penicillin: Secondary | ICD-10-CM

## 2014-02-06 DIAGNOSIS — Q2111 Secundum atrial septal defect: Secondary | ICD-10-CM

## 2014-02-06 DIAGNOSIS — Z9119 Patient's noncompliance with other medical treatment and regimen: Secondary | ICD-10-CM

## 2014-02-06 DIAGNOSIS — Z91018 Allergy to other foods: Secondary | ICD-10-CM

## 2014-02-06 DIAGNOSIS — E669 Obesity, unspecified: Secondary | ICD-10-CM | POA: Diagnosis present

## 2014-02-06 DIAGNOSIS — Z6837 Body mass index (BMI) 37.0-37.9, adult: Secondary | ICD-10-CM

## 2014-02-06 DIAGNOSIS — Z881 Allergy status to other antibiotic agents status: Secondary | ICD-10-CM

## 2014-02-06 DIAGNOSIS — I639 Cerebral infarction, unspecified: Secondary | ICD-10-CM

## 2014-02-06 DIAGNOSIS — Z91199 Patient's noncompliance with other medical treatment and regimen due to unspecified reason: Secondary | ICD-10-CM

## 2014-02-06 DIAGNOSIS — Q211 Atrial septal defect: Secondary | ICD-10-CM

## 2014-02-06 DIAGNOSIS — F411 Generalized anxiety disorder: Secondary | ICD-10-CM | POA: Diagnosis present

## 2014-02-06 DIAGNOSIS — E559 Vitamin D deficiency, unspecified: Secondary | ICD-10-CM | POA: Diagnosis present

## 2014-02-06 DIAGNOSIS — Z8673 Personal history of transient ischemic attack (TIA), and cerebral infarction without residual deficits: Secondary | ICD-10-CM

## 2014-02-06 LAB — I-STAT CHEM 8, ED
BUN: 8 mg/dL (ref 6–23)
CALCIUM ION: 1.17 mmol/L (ref 1.12–1.23)
CREATININE: 0.6 mg/dL (ref 0.50–1.10)
Chloride: 106 mEq/L (ref 96–112)
Glucose, Bld: 109 mg/dL — ABNORMAL HIGH (ref 70–99)
HCT: 38 % (ref 36.0–46.0)
HEMOGLOBIN: 12.9 g/dL (ref 12.0–15.0)
Potassium: 4 mEq/L (ref 3.7–5.3)
SODIUM: 139 meq/L (ref 137–147)
TCO2: 23 mmol/L (ref 0–100)

## 2014-02-06 LAB — CBC
HEMATOCRIT: 34 % — AB (ref 36.0–46.0)
HEMOGLOBIN: 10.5 g/dL — AB (ref 12.0–15.0)
MCH: 23.1 pg — ABNORMAL LOW (ref 26.0–34.0)
MCHC: 30.9 g/dL (ref 30.0–36.0)
MCV: 74.9 fL — AB (ref 78.0–100.0)
Platelets: 346 10*3/uL (ref 150–400)
RBC: 4.54 MIL/uL (ref 3.87–5.11)
RDW: 23.6 % — ABNORMAL HIGH (ref 11.5–15.5)
WBC: 8 10*3/uL (ref 4.0–10.5)

## 2014-02-06 LAB — DIFFERENTIAL
BASOS PCT: 1 % (ref 0–1)
Basophils Absolute: 0.1 10*3/uL (ref 0.0–0.1)
EOS PCT: 1 % (ref 0–5)
Eosinophils Absolute: 0.1 10*3/uL (ref 0.0–0.7)
LYMPHS ABS: 2.7 10*3/uL (ref 0.7–4.0)
LYMPHS PCT: 34 % (ref 12–46)
MONOS PCT: 13 % — AB (ref 3–12)
Monocytes Absolute: 1 10*3/uL (ref 0.1–1.0)
NEUTROS ABS: 4.1 10*3/uL (ref 1.7–7.7)
Neutrophils Relative %: 51 % (ref 43–77)

## 2014-02-06 LAB — CBG MONITORING, ED: GLUCOSE-CAPILLARY: 107 mg/dL — AB (ref 70–99)

## 2014-02-06 LAB — URINALYSIS, ROUTINE W REFLEX MICROSCOPIC
Bilirubin Urine: NEGATIVE
GLUCOSE, UA: NEGATIVE mg/dL
Hgb urine dipstick: NEGATIVE
Ketones, ur: NEGATIVE mg/dL
LEUKOCYTES UA: NEGATIVE
Nitrite: NEGATIVE
Protein, ur: NEGATIVE mg/dL
Specific Gravity, Urine: 1.007 (ref 1.005–1.030)
UROBILINOGEN UA: 0.2 mg/dL (ref 0.0–1.0)
pH: 7 (ref 5.0–8.0)

## 2014-02-06 LAB — RAPID URINE DRUG SCREEN, HOSP PERFORMED
Amphetamines: NOT DETECTED
Barbiturates: NOT DETECTED
Benzodiazepines: NOT DETECTED
Cocaine: NOT DETECTED
Opiates: NOT DETECTED
TETRAHYDROCANNABINOL: NOT DETECTED

## 2014-02-06 LAB — COMPREHENSIVE METABOLIC PANEL
ALT: 20 U/L (ref 0–35)
ANION GAP: 15 (ref 5–15)
AST: 21 U/L (ref 0–37)
Albumin: 3.7 g/dL (ref 3.5–5.2)
Alkaline Phosphatase: 60 U/L (ref 39–117)
BUN: 9 mg/dL (ref 6–23)
CHLORIDE: 100 meq/L (ref 96–112)
CO2: 22 meq/L (ref 19–32)
Calcium: 9 mg/dL (ref 8.4–10.5)
Creatinine, Ser: 0.65 mg/dL (ref 0.50–1.10)
GFR calc Af Amer: >90 mL/min (ref >90)
Glucose, Bld: 107 mg/dL — ABNORMAL HIGH (ref 70–99)
Potassium: 4.2 mEq/L (ref 3.7–5.3)
Sodium: 137 mEq/L (ref 137–147)
Total Protein: 7.6 g/dL (ref 6.0–8.3)

## 2014-02-06 LAB — APTT: APTT: 30 s (ref 24–37)

## 2014-02-06 LAB — I-STAT TROPONIN, ED: Troponin i, poc: 0 ng/mL (ref 0.00–0.08)

## 2014-02-06 LAB — ETHANOL: Alcohol, Ethyl (B): <11 mg/dL (ref 0–11)

## 2014-02-06 LAB — PROTIME-INR
INR: 0.98 (ref 0.00–1.49)
Prothrombin Time: 13 seconds (ref 11.6–15.2)

## 2014-02-06 LAB — PREGNANCY, URINE: Preg Test, Ur: NEGATIVE

## 2014-02-06 MED ORDER — ASPIRIN 81 MG PO CHEW
324.0000 mg | CHEWABLE_TABLET | Freq: Once | ORAL | Status: AC
  Administered 2014-02-06: 324 mg via ORAL
  Filled 2014-02-06: qty 4

## 2014-02-06 MED ORDER — ALPRAZOLAM 0.5 MG PO TABS
0.5000 mg | ORAL_TABLET | Freq: Three times a day (TID) | ORAL | Status: DC | PRN
  Administered 2014-02-07 – 2014-02-08 (×2): 0.5 mg via ORAL
  Filled 2014-02-06 (×2): qty 1

## 2014-02-06 MED ORDER — VITAMIN D 1000 UNITS PO TABS
5000.0000 [IU] | ORAL_TABLET | Freq: Every morning | ORAL | Status: DC
  Administered 2014-02-07 – 2014-02-09 (×3): 5000 [IU] via ORAL
  Filled 2014-02-06 (×4): qty 5

## 2014-02-06 MED ORDER — SENNOSIDES-DOCUSATE SODIUM 8.6-50 MG PO TABS
1.0000 | ORAL_TABLET | Freq: Every evening | ORAL | Status: DC | PRN
  Filled 2014-02-06: qty 1

## 2014-02-06 MED ORDER — ASPIRIN EC 81 MG PO TBEC
81.0000 mg | DELAYED_RELEASE_TABLET | Freq: Every day | ORAL | Status: DC
  Administered 2014-02-07 – 2014-02-09 (×3): 81 mg via ORAL
  Filled 2014-02-06 (×4): qty 1

## 2014-02-06 MED ORDER — ACETAMINOPHEN 500 MG PO TABS
1000.0000 mg | ORAL_TABLET | Freq: Four times a day (QID) | ORAL | Status: DC | PRN
  Administered 2014-02-07: 1000 mg via ORAL
  Filled 2014-02-06: qty 2

## 2014-02-06 MED ORDER — ENOXAPARIN SODIUM 40 MG/0.4ML ~~LOC~~ SOLN
40.0000 mg | Freq: Every day | SUBCUTANEOUS | Status: DC
  Administered 2014-02-07 – 2014-02-08 (×2): 40 mg via SUBCUTANEOUS
  Filled 2014-02-06 (×4): qty 0.4

## 2014-02-06 MED ORDER — STROKE: EARLY STAGES OF RECOVERY BOOK
Freq: Once | Status: AC
  Administered 2014-02-07: 08:00:00
  Filled 2014-02-06: qty 1

## 2014-02-06 MED ORDER — FERROUS SULFATE 325 (65 FE) MG PO TABS
325.0000 mg | ORAL_TABLET | Freq: Every day | ORAL | Status: DC
  Administered 2014-02-07 – 2014-02-09 (×3): 325 mg via ORAL
  Filled 2014-02-06 (×5): qty 1

## 2014-02-06 NOTE — Consult Note (Signed)
Reason for Consult: New-onset visual changes and lightheadedness.  HPI:                                                                                                                                          [name] is an 33 y.o. female with a history of TIA in 2011, and anxiety, presenting with new onset visual changes which she describes as inability to focus, with associated lightheadedness which is worse on looking to the left side. At one point she complained of diplopia. She's had no symptoms of this nature previously. TIA consisted of transient weakness involving left side of her face and left upper extremity which resolved in less than 10 minutes. Patient has not been on antiplatelet therapy. Symptom onset today was at 2:30 PM. CT scan of the head showed no acute intracranial abnormality. Patient was beyond time window for consideration for TPA, in addition to having no clear focal deficits on examination.  Past Medical History  Diagnosis Date  . TIA (transient ischemic attack) 2011  . Anxiety   . TIA (transient ischemic attack)     Past Surgical History  Procedure Laterality Date  . Cesarean section  2003, 2012    No family history on file.  Social History:  reports that she has never smoked. She has never used smokeless tobacco. She reports that she does not drink alcohol or use illicit drugs.  Allergies  Allergen Reactions  . Citalopram Hydrobromide   . Erythromycin   . Penicillins   . Strawberry   . Watermelon [Citrullus Vulgaris]     MEDICATIONS:                                                                                                                     I have reviewed the patient's current medications.   ROS:  History obtained from spouse and the patient  General ROS: negative for - chills, fatigue, fever, night  sweats, weight gain or weight loss Psychological ROS: Recent episode of chest pain thought to likely be a manifestation of hyperventilation Ophthalmic ROS: negative for - blurry vision, double vision, eye pain or loss of vision ENT ROS: negative for - epistaxis, nasal discharge, oral lesions, sore throat, tinnitus or vertigo Allergy and Immunology ROS: negative for - hives or itchy/watery eyes Hematological and Lymphatic ROS: negative for - bleeding problems, bruising or swollen lymph nodes Endocrine ROS: negative for - galactorrhea, hair pattern changes, polydipsia/polyuria or temperature intolerance Respiratory ROS: negative for - cough, hemoptysis, shortness of breath or wheezing Cardiovascular ROS: negative for - chest pain, dyspnea on exertion, edema or irregular heartbeat Gastrointestinal ROS: negative for - abdominal pain, diarrhea, hematemesis, nausea/vomiting or stool incontinence Genito-Urinary ROS: negative for - dysuria, hematuria, incontinence or urinary frequency/urgency Musculoskeletal ROS: negative for - joint swelling or muscular weakness Neurological ROS: as noted in HPI Dermatological ROS: negative for rash and skin lesion changes   Blood pressure 133/90, pulse 103, temperature 98.7 F (37.1 C), temperature source Oral, resp. rate 19, last menstrual period 12/29/2013, SpO2 100.00%.   Neurologic Examination:                                                                                                      Mental Status: Alert, oriented, moderately anxious.  Speech fluent without evidence of aphasia. Able to follow commands without difficulty. Cranial Nerves: II-Visual fields were normal. Patient was able to read without difficulty, as well as recognize pictures of common objects. III/IV/VI-Pupils were equal and reacted. Extraocular movements were full and conjugate.    V/VII-no facial numbness and no facial weakness. VIII-normal. X-normal speech. Motor: 5/5  bilaterally with normal tone and bulk Sensory: Normal throughout. Deep Tendon Reflexes: 2+ and symmetric. Plantars: Mute bilaterally Cerebellar: Normal finger-to-nose testing.  No results found for this basename: cbc, bmp, coags, chol, tri, ldl, hga1c    Results for orders placed during the hospital encounter of 02/06/14 (from the past 48 hour(s))  CBC     Status: Abnormal   Collection Time    02/06/14  7:52 PM      Result Value Ref Range   WBC 8.0  4.0 - 10.5 K/uL   RBC 4.54  3.87 - 5.11 MIL/uL   Hemoglobin 10.5 (*) 12.0 - 15.0 g/dL   HCT 53.6 (*) 64.4 - 03.4 %   MCV 74.9 (*) 78.0 - 100.0 fL   MCH 23.1 (*) 26.0 - 34.0 pg   MCHC 30.9  30.0 - 36.0 g/dL   RDW 74.2 (*) 59.5 - 63.8 %   Platelets 346  150 - 400 K/uL  DIFFERENTIAL     Status: None   Collection Time    02/06/14  7:52 PM      Result Value Ref Range   Neutrophils Relative % PENDING  43 - 77 %   Neutro Abs PENDING  1.7 - 7.7 K/uL   Band Neutrophils PENDING  0 - 10 %  Lymphocytes Relative PENDING  12 - 46 %   Lymphs Abs PENDING  0.7 - 4.0 K/uL   Monocytes Relative PENDING  3 - 12 %   Monocytes Absolute PENDING  0.1 - 1.0 K/uL   Eosinophils Relative PENDING  0 - 5 %   Eosinophils Absolute PENDING  0.0 - 0.7 K/uL   Basophils Relative PENDING  0 - 1 %   Basophils Absolute PENDING  0.0 - 0.1 K/uL   WBC Morphology PENDING     RBC Morphology PENDING     Smear Review PENDING     nRBC PENDING  0 /100 WBC   Metamyelocytes Relative PENDING     Myelocytes PENDING     Promyelocytes Absolute PENDING     Blasts PENDING    I-STAT TROPOININ, ED     Status: None   Collection Time    02/06/14  8:03 PM      Result Value Ref Range   Troponin i, poc 0.00  0.00 - 0.08 ng/mL   Comment 3            Comment: Due to the release kinetics of cTnI,     a negative result within the first hours     of the onset of symptoms does not rule out     myocardial infarction with certainty.     If myocardial infarction is still suspected,      repeat the test at appropriate intervals.  CBG MONITORING, ED     Status: Abnormal   Collection Time    02/06/14  8:03 PM      Result Value Ref Range   Glucose-Capillary 107 (*) 70 - 99 mg/dL   Comment 1 Notify RN     Comment 2 Documented in Chart    I-STAT CHEM 8, ED     Status: Abnormal   Collection Time    02/06/14  8:08 PM      Result Value Ref Range   Sodium 139  137 - 147 mEq/L   Potassium 4.0  3.7 - 5.3 mEq/L   Chloride 106  96 - 112 mEq/L   BUN 8  6 - 23 mg/dL   Creatinine, Ser 1.610.60  0.50 - 1.10 mg/dL   Glucose, Bld 096109 (*) 70 - 99 mg/dL   Calcium, Ion 0.451.17  4.091.12 - 1.23 mmol/L   TCO2 23  0 - 100 mmol/L   Hemoglobin 12.9  12.0 - 15.0 g/dL   HCT 81.138.0  91.436.0 - 78.246.0 %    No results found.   Assessment/Plan: Etiology for transient visual abnormality is unclear. Patient has no extraocular movement abnormality nor visual field abnormality clinically.  Recommendations: 1. MRI of the brain without contrast rule out possible acute small midbrain and brainstem infarction, given her history of TIA. 2. Stroke risk assessment and workup if MRI shows acute stroke, including MRA, carotid Doppler study, 2D echocardiogram, hemoglobin A1c, fasting lipid panel, and studies to rule out hypercoagulable state. 3. Physical therapy and occupational therapy consults if MRI shows acute stroke. 4. No further neurodiagnostic studies are indicated if MRI study is unremarkable. 5. Aspirin 81 mg per day.  C.R. Roseanne RenoStewart, MD [facility] [phone]  02/06/2014, 8:13 PM

## 2014-02-06 NOTE — ED Notes (Signed)
Dr. Manus Gunningancour at bedside. Code stroke called. Pt. Transported to CT 1 via w/c.

## 2014-02-06 NOTE — ED Notes (Signed)
Pts husband reports the last time he saw her was earlier today at 1430. At 1455 pt called him and said she couldn't see anything. Pt got home about 5 minutes later around 1500. Pt took a nap and woke up this evening and still felt the same way.

## 2014-02-06 NOTE — ED Notes (Addendum)
Using bathroom.Marland Kitchen. Head felt tight todt[place]  Blurred vision. Rt. Eye better when she covers left eye - vision improves in the left eye. Dizziness, fogginess worse when she turns to the left.  Minor droop of left corner of lip. No dysmathia, maewl, motor and sensation intact. Pearl.

## 2014-02-06 NOTE — H&P (Signed)
[facility] History and Physical  Patient: [name]  VQM:[id number]  DOB: August 02, 1980  DOS: the patient was seen and examined on 02/06/2014 PCP: Shirline Frees, MD  Chief Complaint: Visual disturbances  HPI: [name] is a 33 y.o. female with Past medical history of TIA, PFO. Patient presented with visual disturbances. She mentions that she has history of a TIA left-sided weakness 4 years ago. At which time she had an extensive workup done which was positive for PFO without any DVT and she was recommended to continue with aspirin. Since last one year she has not been taking her aspirin on a regular basis. 4 weeks ago she noticed that she had complaint of dizziness and left-sided numbness and she was seen in the ER but her symptoms resolved within 15 minutes and she did not have any residual symptoms and she was discharged home and was recommended to followup with her PCP.  She went to [place] for one week and came back, with her PCP they found that she had iron deficiency anemia and vitamin D deficiency and she was started on treatment for the same. Today while she was driving back from [place], she was not feeling herself. On arrival to home she started having difficulty controlling her vision and felt everything was swaying along with her eye movements. There was no weakness or numbness no speech difficulty. She attempted to lay down and sleep but despite that she continues to have similar symptoms 2 hours later and she decided to come to the hospital. At the time of my evaluation she mentions her symptoms have been gradually improving still not to her baseline. Pt denies any fever, chills, headache, cough, chest pain, palpitation, shortness of breath, orthopnea, PND, nausea, vomiting, abdominal pain, diarrhea, constipation, active bleeding, burning urination, dizziness, pedal edema,  focal neurological deficit.   The patient is coming from home. And at her  baseline independent for most of her ADL.  Review of Systems: as mentioned in the history of present illness.  A Comprehensive review of the other systems is negative.  Past Medical History  Diagnosis Date  . TIA (transient ischemic attack) 2011  . Anxiety   . TIA (transient ischemic attack)    Past Surgical History  Procedure Laterality Date  . Cesarean section  2003, 2012   Social History:  reports that she has never smoked. She has never used smokeless tobacco. She reports that she does not drink alcohol or use illicit drugs.  Allergies  Allergen Reactions  . Strawberry Shortness Of Breath and Swelling    Tongue swelling  . Watermelon [Citrullus Vulgaris] Shortness Of Breath and Swelling  . Citalopram Hydrobromide Other (See Comments)    Was ineffective for patient  . Erythromycin Nausea And Vomiting  . Penicillins Hives, Itching and Rash    No family history on file.  Prior to Admission medications   Medication Sig Start Date End Date Taking? Authorizing Provider  acetaminophen (TYLENOL) 500 MG tablet Take 1,000 mg by mouth every 6 (six) hours as needed for moderate pain.   Yes Historical Provider, MD  ALPRAZolam Duanne Moron) 0.5 MG tablet Take 0.5 mg by mouth 3 (three) times daily as needed for anxiety.    Yes Historical Provider, MD  Cholecalciferol (VITAMIN D3) 5000 UNITS TABS Take 5,000 Units by mouth every morning.    Yes Historical Provider, MD  ferrous sulfate 325 (65 FE) MG EC tablet Take 325 mg by mouth daily with breakfast.   Yes  Historical Provider, MD  Multiple Vitamin (MULTIVITAMIN WITH MINERALS) TABS tablet Take 1 tablet by mouth every morning.    Yes Historical Provider, MD    Physical Exam: Filed Vitals:   02/06/14 2200 02/06/14 2215 02/06/14 2230 02/06/14 2300  BP: 116/79 119/80 111/80 125/85  Pulse: 93 97 90 88  Temp:    98.9 F (37.2 C)  TempSrc:    Oral  Resp: _0 Height:    _1  (1.626 m)  Weight:    100.472 kg (221 lb 8 oz)  SpO2: 97%  99% 99% 98%    General: Alert, Awake and Oriented to Time, Place and Person. Appear in mild distress Eyes: PERRL ENT: Oral Mucosa clear moist. Neck: No JVD Cardiovascular: S1 and S2 Present, No Murmur, Peripheral Pulses Present Respiratory: Bilateral Air entry equal and Decreased, Clear to Auscultation,  No Crackles,No wheezes Abdomen: Bowel Sound Present, Soft and Non tender Skin: No Rash Extremities: No Pedal edema, No calf tenderness Neurologic: Grossly no focal neuro deficit. Mental status AAOx3, speech normal, attention normal, Cranial Nerves PERRL, EOM normal and present, facial sensation to light touch present: , Motor strength bilateral equal strength 5/5, Sensation present to light touch, reflexes present knee and biceps, babinski negative, Proprioception normal, Cerebellar test normal finger nose finger.  Labs on Admission:  CBC:  Recent Labs Lab 02/06/14 1952 02/06/14 2008  WBC 8.0  --   NEUTROABS 4.1  --   HGB 10.5* 12.9  HCT 34.0* 38.0  MCV 74.9*  --   PLT 346  --     CMP     Component Value Date/Time   NA 139 02/06/2014 2008   K 4.0 02/06/2014 2008   CL 106 02/06/2014 2008   CO2 22 02/06/2014 1952   GLUCOSE 109* 02/06/2014 2008   BUN 8 02/06/2014 2008   CREATININE 0.60 02/06/2014 2008   CALCIUM 9.0 02/06/2014 1952   PROT 7.6 02/06/2014 1952   ALBUMIN 3.7 02/06/2014 1952   AST 21 02/06/2014 1952   ALT 20 02/06/2014 1952   ALKPHOS 60 02/06/2014 1952   BILITOT <0.2* 02/06/2014 1952   GFRNONAA >90 02/06/2014 1952   GFRAA >90 02/06/2014 1952    No results found for this basename: LIPASE, AMYLASE,  in the last 168 hours No results found for this basename: AMMONIA,  in the last 168 hours  No results found for this basename: CKTOTAL, CKMB, CKMBINDEX, TROPONINI,  in the last 168 hours BNP (last 3 results) No results found for this basename: PROBNP,  in the last 8760 hours  Radiological Exams on Admission: Ct Head Wo Contrast  02/06/2014   CLINICAL DATA:  Code stroke.   EXAM: CT HEAD WITHOUT CONTRAST  TECHNIQUE: Contiguous axial images were obtained from the base of the skull through the vertex without intravenous contrast.  COMPARISON:  MRI 02/09/2010.  CT 02/07/2010.  FINDINGS: No mass. No hydrocephalus. No hemorrhage. No acute bony abnormality.  IMPRESSION: Negative exam. These results were called by telephone at the time of interpretation on 02/06/2014 at 8:22 PM to Dr. Nicole Kindred, who verbally acknowledged these results.   Electronically Signed   By: Marcello Moores  Register   On: 02/06/2014 20:24   Mr Jodene Nam Head Wo Contrast  02/06/2014   CLINICAL DATA:  New onset of visual difficulty, and lightheadedness. Diplopia.  EXAM: MRI HEAD WITHOUT CONTRAST  MRA HEAD WITHOUT CONTRAST  TECHNIQUE: Multiplanar, multiecho pulse sequences of the brain and surrounding structures were obtained without intravenous contrast. Angiographic images  of the head were obtained using MRA technique without contrast.  COMPARISON:  CT head earlier in the day.  MR head 01/30/2010.  FINDINGS: MRI HEAD FINDINGS  There is an acute right paramedian midbrain infarct involving the periaqueductal gray matter. This measures approximately 4 x 7 mm. No other acute infarcts are seen. Remote right greater than left subcentimeter infarcts in the cerebellar hemispheres suggest previous posterior circulation ischemia. No mass lesion, hydrocephalus, or extra-axial fluid.  Normal cerebral volume. No white matter disease. Heterogeneity of the bone marrow in the clivus and upper cervical region, with loss of the normal T1 hyperintense marrow, likely related to the patient's anemia. Smoking or chronic disease could have a similar appearance.  No acute sinus or mastoid disease. Right middle turbinate concha bullosa. Negative orbits.  Compared today's earlier CT, the infarct is not visible. Compared to prior MR, the infarct was not present.  MRA HEAD FINDINGS  The internal carotid arteries are widely patent. The basilar artery is widely  patent with vertebrals codominant. Bilateral fetal PCA origins. No proximal stenosis or vascular occlusion. No intracranial aneurysm.  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 No other acute infarcts are seen.  Evidence for chronic bilateral cerebellar infarcts.  Unremarkable MRA intracranial.   Electronically Signed   By: Rolla Flatten M.D.   On: 02/06/2014 21:39   Mr Brain Wo Contrast  02/06/2014   CLINICAL DATA:  New onset of visual difficulty, and lightheadedness. Diplopia.  EXAM: MRI HEAD WITHOUT CONTRAST  MRA HEAD WITHOUT CONTRAST  TECHNIQUE: Multiplanar, multiecho pulse sequences of the brain and surrounding structures were obtained without intravenous contrast. Angiographic images of the head were obtained using MRA technique without contrast.  COMPARISON:  CT head earlier in the day.  MR head 01/30/2010.  FINDINGS: MRI HEAD FINDINGS  There is an acute right paramedian midbrain infarct involving the periaqueductal gray matter. This measures approximately 4 x 7 mm. No other acute infarcts are seen. Remote right greater than left subcentimeter infarcts in the cerebellar hemispheres suggest previous posterior circulation ischemia. No mass lesion, hydrocephalus, or extra-axial fluid.  Normal cerebral volume. No white matter disease. Heterogeneity of the bone marrow in the clivus and upper cervical region, with loss of the normal T1 hyperintense marrow, likely related to the patient's anemia. Smoking or chronic disease could have a similar appearance.  No acute sinus or mastoid disease. Right middle turbinate concha bullosa. Negative orbits.  Compared today's earlier CT, the infarct is not visible. Compared to prior MR, the infarct was not present.  MRA HEAD FINDINGS  The internal carotid arteries are widely patent. The basilar artery is widely patent with vertebrals codominant. Bilateral fetal PCA origins. No proximal stenosis or vascular occlusion. No  intracranial aneurysm.  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 No other acute infarcts are seen.  Evidence for chronic bilateral cerebellar infarcts.  Unremarkable MRA intracranial.   Electronically Signed   By: Rolla Flatten M.D.   On: 02/06/2014 21:39    EKG: Independently reviewed. normal sinus rhythm, nonspecific ST and T waves changes.  Assessment/Plan Principal Problem:   Acute CVA (cerebrovascular accident) Active Problems:   Microcytic anemia   1. Acute CVA (cerebrovascular accident) Patient is presenting with complaints of blurred vision. MRI of the brain is positive for right midbrain infarct. It is also positive for chronic bilateral cerebellar infarct. Patient denies any family history of blood disorder or recurrent CVA or vasculitis. It is unclear  whether she had vasculitis workup done during her prior TIA. Currently she is admitted to the hospital we will monitor her on telemetry, given recheck echocardiogram with bubble study for PFO, carotid Doppler, PTOT and speech consultation, aspirin 81 mg daily as per neurology, check hemoglobin A1c, Factor V,ESR, CRP, ANA. She may require further workup which may need to be done as an outpatient.  2.Microcytic anemia Continue iron supplements  3.Vitamin D deficiency Continue vitamin D supplements.  Consults: Neurology  DVT Prophylaxis: subcutaneous Heparin Nutrition: Cardiac diet  Code Status: Full  Family Communication: Significant other was present at bedside, opportunity was given to ask question and all questions were answered satisfactorily at the time of interview. Disposition: Admitted to inpatient in telemetry unit.  Author: Berle Mull, MD [facility] Pager: [phone] 02/06/2014, 11:45 PM    If 7PM-7AM, please contact night-coverage [email or web address] Password [email or web address]  **Disclaimer: This note may have been dictated with voice recognition software. Similar  sounding words can inadvertently be transcribed and this note may contain transcription errors which may not have been corrected upon publication of note.**

## 2014-02-06 NOTE — ED Notes (Signed)
Report attempted 

## 2014-02-06 NOTE — Progress Notes (Addendum)
Pt began s/s of dizziness light headed ness [place] blurred vision while at home in bathroom. Last seen normal at 1430. Upon arrival to ED pt complains of dizziness and severe blurred vision in bilateral eyes. Pt has hx of tia but is not taking blood thinners proscribed. Pt out of window for stroke interventions at this time. CT negative for stroke, MRI ordered stat per Dr. Roseanne RenoStewart. NIHSS 2 on my assessment at 2005, with drift in both legs. CBG 107. Per Dr.Stewart MRI reviewed and positive right paramedian midbrain infarct. Pt to be admitted to per [facility] to telemetry floor.

## 2014-02-06 NOTE — ED Provider Notes (Signed)
CSN: [id number]     Arrival date & time 02/06/14  1929 History   First MD Initiated Contact with Patient 02/06/14 1941     Chief Complaint  Patient presents with  . Code Stroke     (Consider location/radiation/quality/duration/timing/severity/associated sxs/prior Treatment) HPI Comments: Patient reports history of TIA and developed double vision today around 3:00. This improved when she closes either eye. He endorses some dizziness and lightheadedness with slight headache. Questionable facial droop. Less than normal at 3 PM. She woke up from a nap hoping her symptoms were getting better but it did not. She denies any chest pain or shortness of breath. No focal weakness, numbness or tingling. She is hyperventilating on arrival. [facility] has a history of PFO, noncompliant with ASA.  Previous TIA in 2011 that presented as L sided weakness.  The history is provided by the patient.    Past Medical History  Diagnosis Date  . TIA (transient ischemic attack) 2011  . Anxiety   . TIA (transient ischemic attack)    Past Surgical History  Procedure Laterality Date  . Cesarean section  2003, 2012   No family history on file. History  Substance Use Topics  . Smoking status: Never Smoker   . Smokeless tobacco: Never Used  . Alcohol Use: No   OB History   Grav Para Term Preterm Abortions TAB SAB Ect Mult Living                 Review of Systems  Constitutional: Negative for fever, activity change and appetite change.  HENT: Negative for congestion and rhinorrhea.   Eyes: Positive for visual disturbance. Negative for pain.  Respiratory: Negative for cough, chest tightness and shortness of breath.   Cardiovascular: Negative for chest pain.  Gastrointestinal: Negative for nausea, vomiting and abdominal pain.  Genitourinary: Negative for dysuria, hematuria, vaginal bleeding and vaginal discharge.  Musculoskeletal: Negative for arthralgias and myalgias.  Skin: Negative for rash.  Neurological:  Positive for dizziness, light-headedness and headaches. Negative for weakness.  A complete 10 system review of systems was obtained and all systems are negative except as noted in the HPI and PMH.      Allergies  Strawberry; Watermelon; Citalopram hydrobromide; Erythromycin; and Penicillins  Home Medications   Prior to Admission medications   Medication Sig Start Date End Date Taking? Authorizing Provider  acetaminophen (TYLENOL) 500 MG tablet Take 1,000 mg by mouth every 6 (six) hours as needed for moderate pain.   Yes Historical Provider, MD  ALPRAZolam Prudy Feeler) 0.5 MG tablet Take 0.5 mg by mouth 3 (three) times daily as needed for anxiety.    Yes Historical Provider, MD  Cholecalciferol (VITAMIN D3) 5000 UNITS TABS Take 5,000 Units by mouth every morning.    Yes Historical Provider, MD  ferrous sulfate 325 (65 FE) MG EC tablet Take 325 mg by mouth daily with breakfast.   Yes Historical Provider, MD  Multiple Vitamin (MULTIVITAMIN WITH MINERALS) TABS tablet Take 1 tablet by mouth every morning.    Yes Historical Provider, MD   BP 125/85  Pulse 88  Temp(Src) 98.9 F (37.2 C) (Oral)  Resp 16  Ht 5\' 4"  (1.626 m)  Wt 221 lb 8 oz (100.472 kg)  BMI 38.00 kg/m2  SpO2 98%  LMP 12/29/2013 Physical Exam  Nursing note and vitals reviewed. Constitutional: She is oriented to person, place, and time. She appears well-developed and well-nourished. No distress.  HENT:  Head: Normocephalic and atraumatic.  Mouth/Throat: Oropharynx is clear and moist.  No oropharyngeal exudate.  Eyes: Conjunctivae and EOM are normal. Pupils are equal, round, and reactive to light.  Neck: Normal range of motion. Neck supple.  No meningismus.  Cardiovascular: Normal rate, regular rhythm, normal heart sounds and intact distal pulses.   No murmur heard. Pulmonary/Chest: Effort normal and breath sounds normal. No respiratory distress.  Abdominal: Soft. There is no tenderness. There is no rebound and no guarding.   Musculoskeletal: Normal range of motion. She exhibits no edema and no tenderness.  Neurological: She is alert and oriented to person, place, and time. No cranial nerve deficit. She exhibits normal muscle tone. Coordination normal.  No ataxia on finger to nose bilaterally. No pronator drift. 5/5 strength throughout. CN 2-12 intact. Negative Romberg. Equal grip strength. Sensation intact. Gait is normal.   Skin: Skin is warm.  Psychiatric: She has a normal mood and affect. Her behavior is normal.    ED Course  Procedures (including critical care time) Labs Review Labs Reviewed  CBC - Abnormal; Notable for the following:    Hemoglobin 10.5 (*)    HCT 34.0 (*)    MCV 74.9 (*)    MCH 23.1 (*)    RDW 23.6 (*)    All other components within normal limits  DIFFERENTIAL - Abnormal; Notable for the following:    Monocytes Relative 13 (*)    All other components within normal limits  COMPREHENSIVE METABOLIC PANEL - Abnormal; Notable for the following:    Glucose, Bld 107 (*)    Total Bilirubin <0.2 (*)    All other components within normal limits  I-STAT CHEM 8, ED - Abnormal; Notable for the following:    Glucose, Bld 109 (*)    All other components within normal limits  CBG MONITORING, ED - Abnormal; Notable for the following:    Glucose-Capillary 107 (*)    All other components within normal limits  ETHANOL  PROTIME-INR  APTT  URINE RAPID DRUG SCREEN (HOSP PERFORMED)  URINALYSIS, ROUTINE W REFLEX MICROSCOPIC  PREGNANCY, URINE  HEMOGLOBIN A1C  LIPID PANEL  I-STAT TROPOININ, ED  I-STAT TROPOININ, ED    Imaging Review Ct Head Wo Contrast  02/06/2014   CLINICAL DATA:  Code stroke.  EXAM: CT HEAD WITHOUT CONTRAST  TECHNIQUE: Contiguous axial images were obtained from the base of the skull through the vertex without intravenous contrast.  COMPARISON:  MRI 02/09/2010.  CT 02/07/2010.  FINDINGS: No mass. No hydrocephalus. No hemorrhage. No acute bony abnormality.  IMPRESSION: Negative  exam. These results were called by telephone at the time of interpretation on 02/06/2014 at 8:22 PM to Dr. Roseanne RenoStewart, who verbally acknowledged these results.   Electronically Signed   By: Maisie Fushomas  Register   On: 02/06/2014 20:24   Mr Maxine GlennMra Head Wo Contrast  02/06/2014   CLINICAL DATA:  New onset of visual difficulty, and lightheadedness. Diplopia.  EXAM: MRI HEAD WITHOUT CONTRAST  MRA HEAD WITHOUT CONTRAST  TECHNIQUE: Multiplanar, multiecho pulse sequences of the brain and surrounding structures were obtained without intravenous contrast. Angiographic images of the head were obtained using MRA technique without contrast.  COMPARISON:  CT head earlier in the day.  MR head 01/30/2010.  FINDINGS: MRI HEAD FINDINGS  There is an acute right paramedian midbrain infarct involving the periaqueductal gray matter. This measures approximately 4 x 7 mm. No other acute infarcts are seen. Remote right greater than left subcentimeter infarcts in the cerebellar hemispheres suggest previous posterior circulation ischemia. No mass lesion, hydrocephalus, or extra-axial fluid.  Normal  cerebral volume. No white matter disease. Heterogeneity of the bone marrow in the clivus and upper cervical region, with loss of the normal T1 hyperintense marrow, likely related to the patient's anemia. Smoking or chronic disease could have a similar appearance.  No acute sinus or mastoid disease. Right middle turbinate concha bullosa. Negative orbits.  Compared today's earlier CT, the infarct is not visible. Compared to prior MR, the infarct was not present.  MRA HEAD FINDINGS  The internal carotid arteries are widely patent. The basilar artery is widely patent with vertebrals codominant. Bilateral fetal PCA origins. No proximal stenosis or vascular occlusion. No intracranial aneurysm.  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 No other acute infarcts are seen.  Evidence for chronic bilateral cerebellar  infarcts.  Unremarkable MRA intracranial.   Electronically Signed   By: Davonna Belling M.D.   On: 02/06/2014 21:39   Mr Brain Wo Contrast  02/06/2014   CLINICAL DATA:  New onset of visual difficulty, and lightheadedness. Diplopia.  EXAM: MRI HEAD WITHOUT CONTRAST  MRA HEAD WITHOUT CONTRAST  TECHNIQUE: Multiplanar, multiecho pulse sequences of the brain and surrounding structures were obtained without intravenous contrast. Angiographic images of the head were obtained using MRA technique without contrast.  COMPARISON:  CT head earlier in the day.  MR head 01/30/2010.  FINDINGS: MRI HEAD FINDINGS  There is an acute right paramedian midbrain infarct involving the periaqueductal gray matter. This measures approximately 4 x 7 mm. No other acute infarcts are seen. Remote right greater than left subcentimeter infarcts in the cerebellar hemispheres suggest previous posterior circulation ischemia. No mass lesion, hydrocephalus, or extra-axial fluid.  Normal cerebral volume. No white matter disease. Heterogeneity of the bone marrow in the clivus and upper cervical region, with loss of the normal T1 hyperintense marrow, likely related to the patient's anemia. Smoking or chronic disease could have a similar appearance.  No acute sinus or mastoid disease. Right middle turbinate concha bullosa. Negative orbits.  Compared today's earlier CT, the infarct is not visible. Compared to prior MR, the infarct was not present.  MRA HEAD FINDINGS  The internal carotid arteries are widely patent. The basilar artery is widely patent with vertebrals codominant. Bilateral fetal PCA origins. No proximal stenosis or vascular occlusion. No intracranial aneurysm.  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 No other acute infarcts are seen.  Evidence for chronic bilateral cerebellar infarcts.  Unremarkable MRA intracranial.   Electronically Signed   By: Davonna Belling M.D.   On: 02/06/2014 21:39      EKG Interpretation   Date/Time:  Friday February 06 2014 19:56:51 EDT Ventricular Rate:  107 PR Interval:  166 QRS Duration: 89 QT Interval:  344 QTC Calculation: 459 R Axis:   72 Text Interpretation:  Sinus tachycardia No significant change was found  Confirmed by Manus Gunning  MD, Nitin Mckowen [phone]) on 02/06/2014 8:02:28 PM      MDM   Final diagnoses:  Stroke   Patient with vision change that onset around 3 PM she endorses double vision that is better with each eye being closed. Code stroke was activated in triage.  CT head negative. Patient not tPA candidate due to rapidly improving symptoms and delay in presentation.   Dr. Roseanne Reno does not appreciate any double vision or other focal deficits on his exam. He does recommend MRI given patient's history of TIA   MRI shows acute pontine infarct. Admission dw Dr. Allena Katz.     Jeannett Senior  Damariz Paganelli, MD 02/07/14 1610

## 2014-02-07 DIAGNOSIS — I517 Cardiomegaly: Secondary | ICD-10-CM

## 2014-02-07 DIAGNOSIS — I6350 Cerebral infarction due to unspecified occlusion or stenosis of unspecified cerebral artery: Secondary | ICD-10-CM

## 2014-02-07 LAB — COMPREHENSIVE METABOLIC PANEL
ALK PHOS: 56 U/L (ref 39–117)
ALT: 17 U/L (ref 0–35)
ANION GAP: 11 (ref 5–15)
AST: 17 U/L (ref 0–37)
Albumin: 3.3 g/dL — ABNORMAL LOW (ref 3.5–5.2)
BUN: 8 mg/dL (ref 6–23)
CO2: 26 meq/L (ref 19–32)
Calcium: 8.7 mg/dL (ref 8.4–10.5)
Chloride: 105 mEq/L (ref 96–112)
Creatinine, Ser: 0.65 mg/dL (ref 0.50–1.10)
GFR calc Af Amer: >90 mL/min (ref >90)
GFR calc non Af Amer: >90 mL/min (ref >90)
GLUCOSE: 101 mg/dL — AB (ref 70–99)
POTASSIUM: 4.2 meq/L (ref 3.7–5.3)
SODIUM: 142 meq/L (ref 137–147)
Total Protein: 6.9 g/dL (ref 6.0–8.3)

## 2014-02-07 LAB — CBC WITH DIFFERENTIAL/PLATELET
BASOS PCT: 1 % (ref 0–1)
Basophils Absolute: 0.1 10*3/uL (ref 0.0–0.1)
Eosinophils Absolute: 0.1 10*3/uL (ref 0.0–0.7)
Eosinophils Relative: 2 % (ref 0–5)
HEMATOCRIT: 34.6 % — AB (ref 36.0–46.0)
HEMOGLOBIN: 10.5 g/dL — AB (ref 12.0–15.0)
LYMPHS PCT: 44 % (ref 12–46)
Lymphs Abs: 3 10*3/uL (ref 0.7–4.0)
MCH: 23.4 pg — AB (ref 26.0–34.0)
MCHC: 30.3 g/dL (ref 30.0–36.0)
MCV: 77.1 fL — ABNORMAL LOW (ref 78.0–100.0)
Monocytes Absolute: 1 10*3/uL (ref 0.1–1.0)
Monocytes Relative: 15 % — ABNORMAL HIGH (ref 3–12)
NEUTROS PCT: 38 % — AB (ref 43–77)
Neutro Abs: 2.5 10*3/uL (ref 1.7–7.7)
Platelets: 334 10*3/uL (ref 150–400)
RBC: 4.49 MIL/uL (ref 3.87–5.11)
RDW: 23.7 % — ABNORMAL HIGH (ref 11.5–15.5)
WBC: 6.7 10*3/uL (ref 4.0–10.5)

## 2014-02-07 LAB — ANTITHROMBIN III: AntiThromb III Func: 94 % (ref 75–120)

## 2014-02-07 LAB — RETICULOCYTES
RBC.: 4.4 MIL/uL (ref 3.87–5.11)
RETIC CT PCT: 3.1 % (ref 0.4–3.1)
Retic Count, Absolute: 136.4 10*3/uL (ref 19.0–186.0)

## 2014-02-07 LAB — HEMOGLOBIN A1C
Hgb A1c MFr Bld: 5.6 % (ref <5.7)
Mean Plasma Glucose: 114 mg/dL (ref <117)

## 2014-02-07 LAB — VITAMIN B12: VITAMIN B 12: 545 pg/mL (ref 211–911)

## 2014-02-07 LAB — LIPID PANEL
CHOLESTEROL: 128 mg/dL (ref 0–200)
HDL: 39 mg/dL — ABNORMAL LOW (ref >39)
LDL CALC: 68 mg/dL (ref 0–99)
TRIGLYCERIDES: 106 mg/dL (ref <150)
Total CHOL/HDL Ratio: 3.3 RATIO
VLDL: 21 mg/dL (ref 0–40)

## 2014-02-07 LAB — IRON AND TIBC
IRON: 24 ug/dL — AB (ref 42–135)
Saturation Ratios: 7 % — ABNORMAL LOW (ref 20–55)
TIBC: 334 ug/dL (ref 250–470)
UIBC: 310 ug/dL (ref 125–400)

## 2014-02-07 LAB — FOLATE

## 2014-02-07 LAB — FERRITIN: Ferritin: 20 ng/mL (ref 10–291)

## 2014-02-07 LAB — SEDIMENTATION RATE: Sed Rate: 28 mm/hr — ABNORMAL HIGH (ref 0–22)

## 2014-02-07 LAB — HOMOCYSTEINE: HOMOCYSTEINE-NORM: 5.5 umol/L (ref 4.0–15.4)

## 2014-02-07 NOTE — Evaluation (Signed)
Occupational Therapy Evaluation Patient Details Name: [name] MRN: [id number] DOB: 03-04-1981 Today's Date: 02/07/2014    History of Present Illness Patient is a 33 yo female admitted 02/06/14 with visual disturbance.  MRI - Acute right paramedian midbrain infarct.  PMH: TIA, PFO, anxiety.   Clinical Impression   Pt is independent in ADL and ADL transfers although performing somewhat slowly as she continues to report visual changes.  Acuity, ocular movements, and fields are intact and disturbance in improving.  Attempted to reassure pt that she is in good hands.  No further OT needs.    Follow Up Recommendations  No OT follow up    Equipment Recommendations  None recommended by OT    Recommendations for Other Services       Precautions / Restrictions Precautions Precautions: None Restrictions Weight Bearing Restrictions: No      Mobility Bed Mobility Overal bed mobility: Independent                Transfers Overall transfer level: Independent Equipment used: None             General transfer comment: Good balance in standing    Balance                                       ADL Overall ADL's : Modified independent (slower than baseline)                                             Vision                 Additional Comments: pt feels "a little off" due to vision changes, ocular movements and fields are intact   Perception     Praxis      Pertinent Vitals/Pain No pain, VSS     Hand Dominance Right   Extremity/Trunk Assessment Upper Extremity Assessment Upper Extremity Assessment: Overall WFL for tasks assessed   Lower Extremity Assessment Lower Extremity Assessment: Defer to PT evaluation   Cervical / Trunk Assessment Cervical / Trunk Assessment: Normal   Communication Communication Communication: No difficulties   Cognition Arousal/Alertness: Awake/alert Behavior During Therapy:  Anxious Overall Cognitive Status: Within Functional Limits for tasks assessed                     General Comments       Exercises       Shoulder Instructions      Home Living Family/patient expects to be discharged to:: Private residence Living Arrangements: Spouse/significant other;Children Available Help at Discharge: Family;Available 24 hours/day Type of Home: House Home Access: Stairs to enter Enter[employer] CorporationEntrance Stairs-Number of Steps: 4 Entrance Stairs-Rails: Right;Left Home Layout: Two level;Bed/bath upstairs         Bathroom Toilet: Standard     Home Equipment: None      Lives With: Spouse;Son;Daughter    Prior Functioning/Environment Level of Independence: Independent             OT Diagnosis:     OT Problem List:     OT Treatment/Interventions:      OT Goals(Current goals can be found in the care plan section) Acute Rehab OT Goals Patient Stated Goal: find out why she is having strokes  OT Frequency:  Barriers to D/C:            Co-evaluation              End of Session    Activity Tolerance: Patient tolerated treatment well Patient left: in bed;with call bell/phone within reach;with family/visitor present   Time: 1410-1434 OT Time Calculation (min): 24 min Charges:  OT General Charges $OT Visit: 1 Procedure OT Evaluation $Initial OT Evaluation Tier I: 1 Procedure OT Treatments $Self Care/Home Management : 8-22 mins G-Codes:    Evern Bio 02/07/2014, 2:44 PM [phone]

## 2014-02-07 NOTE — Evaluation (Signed)
Speech Language Pathology Evaluation Patient Details Name: [name] MRN: [id number] DOB: 1980/12/30 Today's Date: 02/07/2014 Time: [id number] SLP Time Calculation (min): 19 min  Problem List:  Patient Active Problem List   Diagnosis Date Noted  . Right pontine stroke 02/06/2014  . Stroke 02/06/2014  . Acute CVA (cerebrovascular accident) 02/06/2014  . Microcytic anemia 02/06/2014   Past Medical History:  Past Medical History  Diagnosis Date  . TIA (transient ischemic attack) 2011  . Anxiety   . TIA (transient ischemic attack)    Past Surgical History:  Past Surgical History  Procedure Laterality Date  . Cesarean section  2003, 2012   HPI:  33 y.o. female with Past medical history of TIA, PFO admitted with visual disturbances.  MRI reveals acute right paramedian midbrain infarct involving the periaqueductal gray matter. Evidence for chronic bilateral cerebellar infarcts.    Assessment / Plan / Recommendation Clinical Impression  Pt. appropriately tearful, states she is scared and has questions/thoughts pertaining to PFO diagnosed 4 yrs ago.  Speech-language-cognitve function is unremarkable for impairments at this time.  Able to follow 3 step commands, working and prospective memory, problem solving and awareness intact.  Provided emotional support.  She is encouraged with her vision improvements. No ST needs at present.    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)   Cognition  Overall Cognitive Status: Within Functional Limits for tasks assessed Arousal/Alertness: Awake/alert Orientation Level: Oriented X4 Attention:  Sustained Sustained Attention: Appears intact Memory: Appears intact Awareness: Appears intact Problem Solving: Appears intact Behaviors:  (teary) Safety/Judgment: Appears intact    Comprehension  Auditory Comprehension Overall Auditory Comprehension: Appears within functional limits for tasks assessed Commands: Within Functional Limits Visual Recognition/Discrimination Discrimination: Not tested Reading Comprehension Reading Status: Within funtional limits    Expression Expression Primary Mode of Expression: Verbal Verbal Expression Overall Verbal Expression: Appears within functional limits for tasks assessed Initiation: No impairment Level of Generative/Spontaneous Verbalization: Conversation Repetition: No impairment Naming: No impairment Pragmatics: No impairment Written Expression Dominant Hand: Right Written Expression: Not tested   Oral / Motor Oral Motor/Sensory Function Overall Oral Motor/Sensory Function: Appears within functional limits for tasks assessed Motor Speech Overall Motor Speech: Appears within functional limits for tasks assessed Respiration: Within functional limits Phonation: Normal Resonance: Within functional limits Articulation: Within functional limitis Intelligibility: Intelligible Motor Planning: Witnin functional limits        [employer] PlattLisa Willis Latif Nazareno M.Ed [employer]-SLP Pager [phone]  02/07/2014

## 2014-02-07 NOTE — Progress Notes (Signed)
[facility] PROGRESS NOTE  [name] ZOX:[id number] DOB: 1981-05-27 DOA: 02/06/2014 PCP: Johny Blamer, MD  Assessment/Plan: Principal Problem:   Acute CVA (cerebrovascular accident) - Per Neurology. Please refer to their last note for details regarding their assessment and plans  Active Problems:   Microcytic anemia - hgb stable - anemia panel next am.  Code Status: > 35 minutes Family Communication: None at bedside Disposition Plan: Pending further work up and recommendations.   Consultants:  Neurology  Procedures:  Please see order list.  Antibiotics:  None  HPI/Subjective: Pt has no new complaints. Awaiting further testing.  Objective: Filed Vitals:   02/07/14 1400  BP: 130/86  Pulse: 88  Temp: 97.9 F (36.6 C)  Resp: 18    Intake/Output Summary (Last 24 hours) at 02/07/14 1446 Last data filed at 02/07/14 0900  Gross per 24 hour  Intake    360 ml  Output      0 ml  Net    360 ml   Filed Weights   02/06/14 2300  Weight: 100.472 kg (221 lb 8 oz)    Exam:   General:  Pt in NAD, alert and awake  Cardiovascular: RRR, no rubs  Respiratory: CTA BL, no wheezes  Abdomen: soft, NT, ND  Musculoskeletal: no cyanosis or clubbing   Data Reviewed: Basic Metabolic Panel:  Recent Labs Lab 02/06/14 1952 02/06/14 2008 02/07/14 0355  NA 137 139 142  K 4.2 4.0 4.2  CL 100 106 105  CO2 22  --  26  GLUCOSE 107* 109* 101*  BUN 9 8 8   CREATININE 0.65 0.60 0.65  CALCIUM 9.0  --  8.7   Liver Function Tests:  Recent Labs Lab 02/06/14 1952 02/07/14 0355  AST 21 17  ALT 20 17  ALKPHOS 60 56  BILITOT <0.2* <0.2*  PROT 7.6 6.9  ALBUMIN 3.7 3.3*   No results found for this basename: LIPASE, AMYLASE,  in the last 168 hours No results found for this basename: AMMONIA,  in the last 168 hours CBC:  Recent Labs Lab 02/06/14 1952 02/06/14 2008 02/07/14 0355  WBC 8.0  --  6.7  NEUTROABS 4.1  --  2.5  HGB 10.5* 12.9 10.5*  HCT  34.0* 38.0 34.6*  MCV 74.9*  --  77.1*  PLT 346  --  334   Cardiac Enzymes: No results found for this basename: CKTOTAL, CKMB, CKMBINDEX, TROPONINI,  in the last 168 hours BNP (last 3 results) No results found for this basename: PROBNP,  in the last 8760 hours CBG:  Recent Labs Lab 02/06/14 2003  GLUCAP 107*    No results found for this or any previous visit (from the past 240 hour(s)).   Studies: Ct Head Wo Contrast  02/06/2014   CLINICAL DATA:  Code stroke.  EXAM: CT HEAD WITHOUT CONTRAST  TECHNIQUE: Contiguous axial images were obtained from the base of the skull through the vertex without intravenous contrast.  COMPARISON:  MRI 02/09/2010.  CT 02/07/2010.  FINDINGS: No mass. No hydrocephalus. No hemorrhage. No acute bony abnormality.  IMPRESSION: Negative exam. These results were called by telephone at the time of interpretation on 02/06/2014 at 8:22 PM to Dr. Roseanne Reno, who verbally acknowledged these results.   Electronically Signed   By: Maisie Fus  Register   On: 02/06/2014 20:24   Mr Maxine Glenn Head Wo Contrast  02/06/2014   CLINICAL DATA:  New onset of visual difficulty, and lightheadedness. Diplopia.  EXAM: MRI HEAD WITHOUT CONTRAST  MRA HEAD WITHOUT  CONTRAST  TECHNIQUE: Multiplanar, multiecho pulse sequences of the brain and surrounding structures were obtained without intravenous contrast. Angiographic images of the head were obtained using MRA technique without contrast.  COMPARISON:  CT head earlier in the day.  MR head 01/30/2010.  FINDINGS: MRI HEAD FINDINGS  There is an acute right paramedian midbrain infarct involving the periaqueductal gray matter. This measures approximately 4 x 7 mm. No other acute infarcts are seen. Remote right greater than left subcentimeter infarcts in the cerebellar hemispheres suggest previous posterior circulation ischemia. No mass lesion, hydrocephalus, or extra-axial fluid.  Normal cerebral volume. No white matter disease. Heterogeneity of the bone marrow in  the clivus and upper cervical region, with loss of the normal T1 hyperintense marrow, likely related to the patient's anemia. Smoking or chronic disease could have a similar appearance.  No acute sinus or mastoid disease. Right middle turbinate concha bullosa. Negative orbits.  Compared today's earlier CT, the infarct is not visible. Compared to prior MR, the infarct was not present.  MRA HEAD FINDINGS  The internal carotid arteries are widely patent. The basilar artery is widely patent with vertebrals codominant. Bilateral fetal PCA origins. No proximal stenosis or vascular occlusion. No intracranial aneurysm.  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 No other acute infarcts are seen.  Evidence for chronic bilateral cerebellar infarcts.  Unremarkable MRA intracranial.   Electronically Signed   By: Davonna Belling M.D.   On: 02/06/2014 21:39   Mr Brain Wo Contrast  02/06/2014   CLINICAL DATA:  New onset of visual difficulty, and lightheadedness. Diplopia.  EXAM: MRI HEAD WITHOUT CONTRAST  MRA HEAD WITHOUT CONTRAST  TECHNIQUE: Multiplanar, multiecho pulse sequences of the brain and surrounding structures were obtained without intravenous contrast. Angiographic images of the head were obtained using MRA technique without contrast.  COMPARISON:  CT head earlier in the day.  MR head 01/30/2010.  FINDINGS: MRI HEAD FINDINGS  There is an acute right paramedian midbrain infarct involving the periaqueductal gray matter. This measures approximately 4 x 7 mm. No other acute infarcts are seen. Remote right greater than left subcentimeter infarcts in the cerebellar hemispheres suggest previous posterior circulation ischemia. No mass lesion, hydrocephalus, or extra-axial fluid.  Normal cerebral volume. No white matter disease. Heterogeneity of the bone marrow in the clivus and upper cervical region, with loss of the normal T1 hyperintense marrow, likely related to the  patient's anemia. Smoking or chronic disease could have a similar appearance.  No acute sinus or mastoid disease. Right middle turbinate concha bullosa. Negative orbits.  Compared today's earlier CT, the infarct is not visible. Compared to prior MR, the infarct was not present.  MRA HEAD FINDINGS  The internal carotid arteries are widely patent. The basilar artery is widely patent with vertebrals codominant. Bilateral fetal PCA origins. No proximal stenosis or vascular occlusion. No intracranial aneurysm.  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 No other acute infarcts are seen.  Evidence for chronic bilateral cerebellar infarcts.  Unremarkable MRA intracranial.   Electronically Signed   By: Davonna Belling M.D.   On: 02/06/2014 21:39    Scheduled Meds: . aspirin EC  81 mg Oral Daily  . cholecalciferol  5,000 Units Oral q morning - 10a  . enoxaparin (LOVENOX) injection  40 mg Subcutaneous Daily  . ferrous sulfate  325 mg Oral Q breakfast   Continuous Infusions:    Time spent: > 35 minutes    Ramesses Crampton, [employer]  [facility] Pager [phone] If 7PM-7AM, please contact night-coverage at [email or web address], password [email or web address] 02/07/2014, 2:46 PM  LOS: 1 day

## 2014-02-07 NOTE — Evaluation (Signed)
Physical Therapy Evaluation Patient Details Name: [name] MRN: [id number] DOB: 1981-05-01 Today's Date: 02/07/2014   History of Present Illness  Patient is a 33 yo female admitted 02/06/14 with visual disturbance.  MRI - Acute right paramedian midbrain infarct.  PMH: TIA, PFO, anxiety.  Clinical Impression  Patient is independent with mobility and gait.  Balance is good, scoring 22/24 on DGI balance assessment. Reports visual disturbance is improving, but not "back to normal" - OT to address.  No acute PT needs identified - PT will sign off.    Follow Up Recommendations No PT follow up;Supervision for mobility/OOB    Equipment Recommendations  None recommended by PT    Recommendations for Other Services       Precautions / Restrictions Precautions Precautions: None Restrictions Weight Bearing Restrictions: No      Mobility  Bed Mobility Overal bed mobility: Independent                Transfers Overall transfer level: Independent Equipment used: None             General transfer comment: Good balance in standing  Ambulation/Gait Ambulation/Gait assistance: Modified independent (Device/Increase time) Ambulation Distance (Feet): 300 Feet Assistive device: None Gait Pattern/deviations: Step-through pattern Gait velocity: WFL Gait velocity interpretation: at or above normal speed for age/gender General Gait Details: Guarded gait due to visual disturbance.  Patient with good balance during gait.  Stairs Stairs: Yes Stairs assistance: Modified independent (Device/Increase time) Stair Management: One rail Right;Step to pattern;Forwards Number of Stairs: 5 General stair comments: Instructed patient on step-to technique for safety.  Able to perform alternating as well.   Wheelchair Mobility    Modified Rankin (Stroke Patients Only) Modified Rankin (Stroke Patients Only) Pre-Morbid Rankin Score: No symptoms Modified Rankin: No significant  disability     Balance                                 Standardized Balance Assessment Standardized Balance Assessment : Dynamic Gait Index   Dynamic Gait Index Level Surface: Normal Change in Gait Speed: Normal Gait with Horizontal Head Turns: Normal Gait with Vertical Head Turns: Normal Gait and Pivot Turn: Normal Step Over Obstacle: Mild Impairment Step Around Obstacles: Normal Steps: Mild Impairment Total Score: 22       Pertinent Vitals/Pain     Home Living Family/patient expects to be discharged to:: Private residence Living Arrangements: Spouse/significant other;Children Available Help at Discharge: Family;Available 24 hours/day Type of Home: House Home Access: Stairs to enter Entrance Stairs-Rails: Doctor, general practice of Steps: 4 Home Layout: Two level;Bed/bath upstairs Home Equipment: None      Prior Function Level of Independence: Independent               Hand Dominance   Dominant Hand: Right    Extremity/Trunk Assessment   Upper Extremity Assessment: Overall WFL for tasks assessed           Lower Extremity Assessment: Overall WFL for tasks assessed      Cervical / Trunk Assessment: Normal  Communication   Communication: No difficulties  Cognition Arousal/Alertness: Awake/alert Behavior During Therapy: Anxious (Tearful during session) Overall Cognitive Status: Within Functional Limits for tasks assessed                      General Comments      Exercises        Assessment/Plan  PT Assessment Patent does not need any further PT services  PT Diagnosis     PT Problem List    PT Treatment Interventions     PT Goals (Current goals can be found in the Care Plan section) Acute Rehab PT Goals PT Goal Formulation: No goals set, d/c therapy    Frequency     Barriers to discharge        Co-evaluation               End of Session   Activity Tolerance: Patient tolerated  treatment well Patient left: in bed;with call bell/phone within reach;with family/visitor present Nurse Communication: Mobility status         Time: 1204-1222 PT Time Calculation (min): 18 min   Charges:   PT Evaluation $Initial PT Evaluation Tier I: 1 Procedure PT Treatments $Gait Training: 8-22 mins   PT G Codes:          Vena AustriaDavis, Neldon Shepard H 02/07/2014, 1:39 PM Durenda HurtSusan H. Renaldo Fiddleravis, PT, [facility] Surgicare LtdMBA Acute Rehab Services Pager [phone]

## 2014-02-07 NOTE — Progress Notes (Signed)
Stroke Team Progress Note  HISTORY  33 y.o. female with a history of TIA in 2011, and anxiety, presenting with new onset visual changes which she describes as inability to focus, with associated lightheadedness which is worse on looking to the left side. At one point she complained of diplopia. She's had no symptoms of this nature previously. TIA consisted of transient weakness involving left side of her face and left upper extremity which resolved in less than 10 minutes. Patient has not been on antiplatelet therapy. Symptom onset today was at 2:30 PM. CT scan of the head showed no acute intracranial abnormality. Patient was beyond time window for consideration for TPA, in addition to having no clear focal deficits on examination.   SUBJECTIVE Vision much improved and close to baseline.    OBJECTIVE Most recent Vital Signs: Filed Vitals:   02/07/14 0212 02/07/14 0350 02/07/14 0600 02/07/14 0800  BP: 106/64 116/67 116/71 133/84  Pulse: 89 78 100 83  Temp: 98.3 F (36.8 C) 97.9 F (36.6 C) 98.3 F (36.8 C) 98.7 F (37.1 C)  TempSrc: Oral Oral  Oral  Resp: 18 18 18 18   Height:      Weight:      SpO2: 100% 100% 100% 95%   CBG (last 3)   Recent Labs  02/06/14 2003  GLUCAP 107*    IV Fluid Intake:     MEDICATIONS  .  stroke: mapping our early stages of recovery book   Does not apply Once  . aspirin EC  81 mg Oral Daily  . cholecalciferol  5,000 Units Oral q morning - 10a  . enoxaparin (LOVENOX) injection  40 mg Subcutaneous Daily  . ferrous sulfate  325 mg Oral Q breakfast   PRN:  acetaminophen, ALPRAZolam, senna-docusate  Diet:  Cardiac  Activity:   Up with assistance DVT Prophylaxis:  SCDs  CLINICALLY SIGNIFICANT STUDIES Basic Metabolic Panel:  Recent Labs Lab 02/06/14 1952 02/06/14 2008 02/07/14 0355  NA 137 139 142  K 4.2 4.0 4.2  CL 100 106 105  CO2 22  --  26  GLUCOSE 107* 109* 101*  BUN 9 8 8   CREATININE 0.65 0.60 0.65  CALCIUM 9.0  --  8.7   Liver  Function Tests:  Recent Labs Lab 02/06/14 1952 02/07/14 0355  AST 21 17  ALT 20 17  ALKPHOS 60 56  BILITOT <0.2* <0.2*  PROT 7.6 6.9  ALBUMIN 3.7 3.3*   CBC:  Recent Labs Lab 02/06/14 1952 02/06/14 2008 02/07/14 0355  WBC 8.0  --  6.7  NEUTROABS 4.1  --  2.5  HGB 10.5* 12.9 10.5*  HCT 34.0* 38.0 34.6*  MCV 74.9*  --  77.1*  PLT 346  --  334   Coagulation:  Recent Labs Lab 02/06/14 1952  LABPROT 13.0  INR 0.98   Cardiac Enzymes: No results found for this basename: CKTOTAL, CKMB, CKMBINDEX, TROPONINI,  in the last 168 hours Urinalysis:  Recent Labs Lab 02/06/14 2020  COLORURINE YELLOW  LABSPEC 1.007  PHURINE 7.0  GLUCOSEU NEGATIVE  HGBUR NEGATIVE  BILIRUBINUR NEGATIVE  KETONESUR NEGATIVE  PROTEINUR NEGATIVE  UROBILINOGEN 0.2  NITRITE NEGATIVE  LEUKOCYTESUR NEGATIVE   Lipid Panel    Component Value Date/Time   CHOL 128 02/07/2014 0355   TRIG 106 02/07/2014 0355   HDL 39* 02/07/2014 0355   CHOLHDL 3.3 02/07/2014 0355   VLDL 21 02/07/2014 0355   LDLCALC 68 02/07/2014 0355   HgbA1C  No results found for this basename:  HGBA1C    Urine Drug Screen:     Component Value Date/Time   LABOPIA NONE DETECTED 02/06/2014 2020   COCAINSCRNUR NONE DETECTED 02/06/2014 2020   LABBENZ NONE DETECTED 02/06/2014 2020   AMPHETMU NONE DETECTED 02/06/2014 2020   THCU NONE DETECTED 02/06/2014 2020   LABBARB NONE DETECTED 02/06/2014 2020    Alcohol Level:  Recent Labs Lab 02/06/14 1952  ETH <11    Ct Head Wo Contrast  02/06/2014   CLINICAL DATA:  Code stroke.  EXAM: CT HEAD WITHOUT CONTRAST  TECHNIQUE: Contiguous axial images were obtained from the base of the skull through the vertex without intravenous contrast.  COMPARISON:  MRI 02/09/2010.  CT 02/07/2010.  FINDINGS: No mass. No hydrocephalus. No hemorrhage. No acute bony abnormality.  IMPRESSION: Negative exam. These results were called by telephone at the time of interpretation on 02/06/2014 at 8:22 PM to Dr. Roseanne Reno, who  verbally acknowledged these results.   Electronically Signed   By: Maisie Fus  Register   On: 02/06/2014 20:24   Mr Maxine Glenn Head Wo Contrast  02/06/2014   CLINICAL DATA:  New onset of visual difficulty, and lightheadedness. Diplopia.  EXAM: MRI HEAD WITHOUT CONTRAST  MRA HEAD WITHOUT CONTRAST  TECHNIQUE: Multiplanar, multiecho pulse sequences of the brain and surrounding structures were obtained without intravenous contrast. Angiographic images of the head were obtained using MRA technique without contrast.  COMPARISON:  CT head earlier in the day.  MR head 01/30/2010.  FINDINGS: MRI HEAD FINDINGS  There is an acute right paramedian midbrain infarct involving the periaqueductal gray matter. This measures approximately 4 x 7 mm. No other acute infarcts are seen. Remote right greater than left subcentimeter infarcts in the cerebellar hemispheres suggest previous posterior circulation ischemia. No mass lesion, hydrocephalus, or extra-axial fluid.  Normal cerebral volume. No white matter disease. Heterogeneity of the bone marrow in the clivus and upper cervical region, with loss of the normal T1 hyperintense marrow, likely related to the patient's anemia. Smoking or chronic disease could have a similar appearance.  No acute sinus or mastoid disease. Right middle turbinate concha bullosa. Negative orbits.  Compared today's earlier CT, the infarct is not visible. Compared to prior MR, the infarct was not present.  MRA HEAD FINDINGS  The internal carotid arteries are widely patent. The basilar artery is widely patent with vertebrals codominant. Bilateral fetal PCA origins. No proximal stenosis or vascular occlusion. No intracranial aneurysm.  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 No other acute infarcts are seen.  Evidence for chronic bilateral cerebellar infarcts.  Unremarkable MRA intracranial.   Electronically Signed   By: Davonna Belling M.D.   On: 02/06/2014 21:39   Mr  Brain Wo Contrast  02/06/2014   CLINICAL DATA:  New onset of visual difficulty, and lightheadedness. Diplopia.  EXAM: MRI HEAD WITHOUT CONTRAST  MRA HEAD WITHOUT CONTRAST  TECHNIQUE: Multiplanar, multiecho pulse sequences of the brain and surrounding structures were obtained without intravenous contrast. Angiographic images of the head were obtained using MRA technique without contrast.  COMPARISON:  CT head earlier in the day.  MR head 01/30/2010.  FINDINGS: MRI HEAD FINDINGS  There is an acute right paramedian midbrain infarct involving the periaqueductal gray matter. This measures approximately 4 x 7 mm. No other acute infarcts are seen. Remote right greater than left subcentimeter infarcts in the cerebellar hemispheres suggest previous posterior circulation ischemia. No mass lesion, hydrocephalus, or extra-axial fluid.  Normal cerebral volume. No white matter disease. Heterogeneity of  the bone marrow in the clivus and upper cervical region, with loss of the normal T1 hyperintense marrow, likely related to the patient's anemia. Smoking or chronic disease could have a similar appearance.  No acute sinus or mastoid disease. Right middle turbinate concha bullosa. Negative orbits.  Compared today's earlier CT, the infarct is not visible. Compared to prior MR, the infarct was not present.  MRA HEAD FINDINGS  The internal carotid arteries are widely patent. The basilar artery is widely patent with vertebrals codominant. Bilateral fetal PCA origins. No proximal stenosis or vascular occlusion. No intracranial aneurysm.  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 No other acute infarcts are seen.  Evidence for chronic bilateral cerebellar infarcts.  Unremarkable MRA intracranial.   Electronically Signed   By: Davonna BellingJohn  Curnes M.D.   On: 02/06/2014 21:39    CT of the brain    MRI of the brain  Acute right paramedian midbrain infarct involving the periaqueductal  gray  matter. No other acute infarcts are seen.  Evidence for chronic bilateral cerebellar infarcts.   MRA of the brain  Unremarkable MRA intracranial.   Carotid Doppler  pending 2D Echocardiogram  pending  CXR    EKG sinius Therapy Recommendations pending  Physical Exam   Mental Status:  Alert, oriented, moderately anxious. Speech fluent without evidence of aphasia. Able to follow commands without difficulty.  Cranial Nerves:  II-Visual fields were normal. Patient was able to read without difficulty, as well as recognize pictures of common objects.  III/IV/VI-Pupils were equal and reacted. Extraocular movements were full and conjugate.  V/VII-no facial numbness and no facial weakness.  VIII-normal.  X-normal speech.  Motor: 5/5 bilaterally with normal tone and bulk  Sensory: Normal throughout.  Deep Tendon Reflexes: 2+ and symmetric.  Plantars: Mute bilaterally  Cerebellar: Normal finger-to-nose testing.   ASSESSMENT [name] is a 33 y.o. female  with Past medical history of TIA, PFO.  Patient presented with visual disturbances. She mentions that she has history of a TIA left-sided weakness 4 years ago. Vision improved but not at baseline.       LDL 68     Hospital day # 1  TREATMENT/PLAN  Continue ASA as pt was not on it at home  Hypercoagulable profile  Awaiting for carotids  2decho  With bubble. Last one done 4 yrs ago with PFO, if again seen would consult cardiology for possible closure as pt has strokes in multiple vascular territories.   Possibly will need loop recorder as out pt.   D/w pt and pt's husband at bedside.    SIGNED Pauletta BrownsZEYLIKMAN, Shivangi Lutz   I, the attending vascular neurologist, have personally obtained a history, examined the patient, evaluated laboratory data and imaging studies, and formulated the assessment and plan of care.  I have made any additions or clarifications directly to the above note and agree with the findings and plan  as currently documented.    To contact Stroke Continuity provider, please refer to [email or web address]. After hours, contact General Neurology

## 2014-02-07 NOTE — Progress Notes (Signed)
VASCULAR LAB PRELIMINARY  PRELIMINARY  PRELIMINARY  PRELIMINARY  Bilateral lower extremity venous duplex completed.    Preliminary report:  Bilateral:  No evidence of DVT, superficial thrombosis, or Baker's Cyst.   Markeeta Scalf, RVS 02/07/2014, 4:38 PM

## 2014-02-07 NOTE — Progress Notes (Signed)
VASCULAR LAB PRELIMINARY  PRELIMINARY  PRELIMINARY  PRELIMINARY  Carotid duplex completed.    Preliminary report:  Bilateral:  1-39% ICA stenosis lower end of range.  Vertebral artery flow is antegrade.     Daniesha Driver, RVS 02/07/2014, 4:40 PM

## 2014-02-07 NOTE — Progress Notes (Signed)
Echo Lab  2D Echocardiogram completed.  [name], RDCS 02/07/2014 3:48 PM

## 2014-02-08 NOTE — Progress Notes (Signed)
[facility] PROGRESS NOTE  [name] ZOX:[id number] DOB: 11-Jun-1981 DOA: 02/06/2014 PCP: Johny Blamer, MD  Assessment/Plan: Principal Problem:   Acute CVA (cerebrovascular accident) - Per Neurology. Please refer to their last note for details regarding their assessment and plans - Plan is for TEE - No DVT's reported on LE doppler - No stricture or blockage reported in carotid doppler. - Hypercoagulable work up pending  Active Problems:   Microcytic anemia - hgb stable - anemia panel next am.  Code Status: > 35 minutes Family Communication: None at bedside Disposition Plan: Pending further work up and recommendations.   Consultants:  Neurology  Procedures:  Please see order list.  Antibiotics:  None  HPI/Subjective: Pt has no new complaints. Awaiting further testing.  Objective: Filed Vitals:   02/08/14 1100  BP: 119/75  Pulse: 84  Temp: 98 F (36.7 C)  Resp: 18    Intake/Output Summary (Last 24 hours) at 02/08/14 1618 Last data filed at 02/08/14 1314  Gross per 24 hour  Intake    600 ml  Output      0 ml  Net    600 ml   Filed Weights   02/06/14 2300 02/08/14 0500  Weight: 100.472 kg (221 lb 8 oz) 97.977 kg (216 lb)    Exam:   General:  Pt in NAD, alert and awake  Cardiovascular: RRR, no rubs  Respiratory: CTA BL, no wheezes  Abdomen: soft, NT, ND  Musculoskeletal: no cyanosis or clubbing   Data Reviewed: Basic Metabolic Panel:  Recent Labs Lab 02/06/14 1952 02/06/14 2008 02/07/14 0355  NA 137 139 142  K 4.2 4.0 4.2  CL 100 106 105  CO2 22  --  26  GLUCOSE 107* 109* 101*  BUN 9 8 8   CREATININE 0.65 0.60 0.65  CALCIUM 9.0  --  8.7   Liver Function Tests:  Recent Labs Lab 02/06/14 1952 02/07/14 0355  AST 21 17  ALT 20 17  ALKPHOS 60 56  BILITOT <0.2* <0.2*  PROT 7.6 6.9  ALBUMIN 3.7 3.3*   No results found for this basename: LIPASE, AMYLASE,  in the last 168 hours No results found for this basename:  AMMONIA,  in the last 168 hours CBC:  Recent Labs Lab 02/06/14 1952 02/06/14 2008 02/07/14 0355  WBC 8.0  --  6.7  NEUTROABS 4.1  --  2.5  HGB 10.5* 12.9 10.5*  HCT 34.0* 38.0 34.6*  MCV 74.9*  --  77.1*  PLT 346  --  334   Cardiac Enzymes: No results found for this basename: CKTOTAL, CKMB, CKMBINDEX, TROPONINI,  in the last 168 hours BNP (last 3 results) No results found for this basename: PROBNP,  in the last 8760 hours CBG:  Recent Labs Lab 02/06/14 2003  GLUCAP 107*    No results found for this or any previous visit (from the past 240 hour(s)).   Studies: Ct Head Wo Contrast  02/06/2014   CLINICAL DATA:  Code stroke.  EXAM: CT HEAD WITHOUT CONTRAST  TECHNIQUE: Contiguous axial images were obtained from the base of the skull through the vertex without intravenous contrast.  COMPARISON:  MRI 02/09/2010.  CT 02/07/2010.  FINDINGS: No mass. No hydrocephalus. No hemorrhage. No acute bony abnormality.  IMPRESSION: Negative exam. These results were called by telephone at the time of interpretation on 02/06/2014 at 8:22 PM to Dr. Roseanne Reno, who verbally acknowledged these results.   Electronically Signed   By: Maisie Fus  Register   On: 02/06/2014 20:24  Mr Maxine Glenn Head Wo Contrast  02/06/2014   CLINICAL DATA:  New onset of visual difficulty, and lightheadedness. Diplopia.  EXAM: MRI HEAD WITHOUT CONTRAST  MRA HEAD WITHOUT CONTRAST  TECHNIQUE: Multiplanar, multiecho pulse sequences of the brain and surrounding structures were obtained without intravenous contrast. Angiographic images of the head were obtained using MRA technique without contrast.  COMPARISON:  CT head earlier in the day.  MR head 01/30/2010.  FINDINGS: MRI HEAD FINDINGS  There is an acute right paramedian midbrain infarct involving the periaqueductal gray matter. This measures approximately 4 x 7 mm. No other acute infarcts are seen. Remote right greater than left subcentimeter infarcts in the cerebellar hemispheres suggest  previous posterior circulation ischemia. No mass lesion, hydrocephalus, or extra-axial fluid.  Normal cerebral volume. No white matter disease. Heterogeneity of the bone marrow in the clivus and upper cervical region, with loss of the normal T1 hyperintense marrow, likely related to the patient's anemia. Smoking or chronic disease could have a similar appearance.  No acute sinus or mastoid disease. Right middle turbinate concha bullosa. Negative orbits.  Compared today's earlier CT, the infarct is not visible. Compared to prior MR, the infarct was not present.  MRA HEAD FINDINGS  The internal carotid arteries are widely patent. The basilar artery is widely patent with vertebrals codominant. Bilateral fetal PCA origins. No proximal stenosis or vascular occlusion. No intracranial aneurysm.  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 No other acute infarcts are seen.  Evidence for chronic bilateral cerebellar infarcts.  Unremarkable MRA intracranial.   Electronically Signed   By: Davonna Belling M.D.   On: 02/06/2014 21:39   Mr Brain Wo Contrast  02/06/2014   CLINICAL DATA:  New onset of visual difficulty, and lightheadedness. Diplopia.  EXAM: MRI HEAD WITHOUT CONTRAST  MRA HEAD WITHOUT CONTRAST  TECHNIQUE: Multiplanar, multiecho pulse sequences of the brain and surrounding structures were obtained without intravenous contrast. Angiographic images of the head were obtained using MRA technique without contrast.  COMPARISON:  CT head earlier in the day.  MR head 01/30/2010.  FINDINGS: MRI HEAD FINDINGS  There is an acute right paramedian midbrain infarct involving the periaqueductal gray matter. This measures approximately 4 x 7 mm. No other acute infarcts are seen. Remote right greater than left subcentimeter infarcts in the cerebellar hemispheres suggest previous posterior circulation ischemia. No mass lesion, hydrocephalus, or extra-axial fluid.  Normal cerebral  volume. No white matter disease. Heterogeneity of the bone marrow in the clivus and upper cervical region, with loss of the normal T1 hyperintense marrow, likely related to the patient's anemia. Smoking or chronic disease could have a similar appearance.  No acute sinus or mastoid disease. Right middle turbinate concha bullosa. Negative orbits.  Compared today's earlier CT, the infarct is not visible. Compared to prior MR, the infarct was not present.  MRA HEAD FINDINGS  The internal carotid arteries are widely patent. The basilar artery is widely patent with vertebrals codominant. Bilateral fetal PCA origins. No proximal stenosis or vascular occlusion. No intracranial aneurysm.  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 No other acute infarcts are seen.  Evidence for chronic bilateral cerebellar infarcts.  Unremarkable MRA intracranial.   Electronically Signed   By: Davonna Belling M.D.   On: 02/06/2014 21:39    Scheduled Meds: . aspirin EC  81 mg Oral Daily  . cholecalciferol  5,000 Units Oral q morning - 10a  . enoxaparin (LOVENOX) injection  40  mg Subcutaneous Daily  . ferrous sulfate  325 mg Oral Q breakfast   Continuous Infusions:    Time spent: > 35 minutes    Penny PiaVEGA, [name]  [facility] Pager [phone] If 7PM-7AM, please contact night-coverage at [email or web address], password [email or web address] 02/08/2014, 4:18 PM  LOS: 2 days

## 2014-02-08 NOTE — Progress Notes (Signed)
Stroke Team Progress Note  HISTORY  33 y.o. female with a history of TIA in 2011, and anxiety, presenting with new onset visual changes which she describes as inability to focus, with associated lightheadedness which is worse on looking to the left side. At one point she complained of diplopia. She's had no symptoms of this nature previously. TIA consisted of transient weakness involving left side of her face and left upper extremity which resolved in less than 10 minutes. Patient has not been on antiplatelet therapy. Symptom onset today was at 2:30 PM. CT scan of the head showed no acute intracranial abnormality. Patient was beyond time window for consideration for TPA, in addition to having no clear focal deficits on examination.   SUBJECTIVE Vision much improved and close to baseline.    OBJECTIVE Most recent Vital Signs: Filed Vitals:   02/07/14 2100 02/08/14 0024 02/08/14 0500 02/08/14 0808  BP: 116/81 99/62 98/57  113/68  Pulse: 90 80 76 91  Temp: 98.5 F (36.9 C) 97.9 F (36.6 C) 98.1 F (36.7 C) 97.7 F (36.5 C)  TempSrc:    Oral  Resp: 18 18 18 18   Height:      Weight:   97.977 kg (216 lb)   SpO2: 98% 98% 100% 97%   CBG (last 3)   Recent Labs  02/06/14 2003  GLUCAP 107*    IV Fluid Intake:     MEDICATIONS  . aspirin EC  81 mg Oral Daily  . cholecalciferol  5,000 Units Oral q morning - 10a  . enoxaparin (LOVENOX) injection  40 mg Subcutaneous Daily  . ferrous sulfate  325 mg Oral Q breakfast   PRN:  acetaminophen, ALPRAZolam, senna-docusate  Diet:  Cardiac  Activity:   Up with assistance DVT Prophylaxis:  SCDs  CLINICALLY SIGNIFICANT STUDIES Basic Metabolic Panel:   Recent Labs Lab 02/06/14 1952 02/06/14 2008 02/07/14 0355  NA 137 139 142  K 4.2 4.0 4.2  CL 100 106 105  CO2 22  --  26  GLUCOSE 107* 109* 101*  BUN 9 8 8   CREATININE 0.65 0.60 0.65  CALCIUM 9.0  --  8.7   Liver Function Tests:   Recent Labs Lab 02/06/14 1952 02/07/14 0355   AST 21 17  ALT 20 17  ALKPHOS 60 56  BILITOT <0.2* <0.2*  PROT 7.6 6.9  ALBUMIN 3.7 3.3*   CBC:   Recent Labs Lab 02/06/14 1952 02/06/14 2008 02/07/14 0355  WBC 8.0  --  6.7  NEUTROABS 4.1  --  2.5  HGB 10.5* 12.9 10.5*  HCT 34.0* 38.0 34.6*  MCV 74.9*  --  77.1*  PLT 346  --  334   Coagulation:   Recent Labs Lab 02/06/14 1952  LABPROT 13.0  INR 0.98   Cardiac Enzymes: No results found for this basename: CKTOTAL, CKMB, CKMBINDEX, TROPONINI,  in the last 168 hours Urinalysis:   Recent Labs Lab 02/06/14 2020  COLORURINE YELLOW  LABSPEC 1.007  PHURINE 7.0  GLUCOSEU NEGATIVE  HGBUR NEGATIVE  BILIRUBINUR NEGATIVE  KETONESUR NEGATIVE  PROTEINUR NEGATIVE  UROBILINOGEN 0.2  NITRITE NEGATIVE  LEUKOCYTESUR NEGATIVE   Lipid Panel    Component Value Date/Time   CHOL 128 02/07/2014 0355   TRIG 106 02/07/2014 0355   HDL 39* 02/07/2014 0355   CHOLHDL 3.3 02/07/2014 0355   VLDL 21 02/07/2014 0355   LDLCALC 68 02/07/2014 0355   HgbA1C  Lab Results  Component Value Date   HGBA1C 5.6 02/07/2014    Urine  Drug Screen:     Component Value Date/Time   LABOPIA NONE DETECTED 02/06/2014 2020   COCAINSCRNUR NONE DETECTED 02/06/2014 2020   LABBENZ NONE DETECTED 02/06/2014 2020   AMPHETMU NONE DETECTED 02/06/2014 2020   THCU NONE DETECTED 02/06/2014 2020   LABBARB NONE DETECTED 02/06/2014 2020    Alcohol Level:   Recent Labs Lab 02/06/14 1952  ETH <11    Ct Head Wo Contrast  02/06/2014   CLINICAL DATA:  Code stroke.  EXAM: CT HEAD WITHOUT CONTRAST  TECHNIQUE: Contiguous axial images were obtained from the base of the skull through the vertex without intravenous contrast.  COMPARISON:  MRI 02/09/2010.  CT 02/07/2010.  FINDINGS: No mass. No hydrocephalus. No hemorrhage. No acute bony abnormality.  IMPRESSION: Negative exam. These results were called by telephone at the time of interpretation on 02/06/2014 at 8:22 PM to Dr. Roseanne Reno, who verbally acknowledged these results.    Electronically Signed   By: Maisie Fus  Register   On: 02/06/2014 20:24   Mr Maxine Glenn Head Wo Contrast  02/06/2014   CLINICAL DATA:  New onset of visual difficulty, and lightheadedness. Diplopia.  EXAM: MRI HEAD WITHOUT CONTRAST  MRA HEAD WITHOUT CONTRAST  TECHNIQUE: Multiplanar, multiecho pulse sequences of the brain and surrounding structures were obtained without intravenous contrast. Angiographic images of the head were obtained using MRA technique without contrast.  COMPARISON:  CT head earlier in the day.  MR head 01/30/2010.  FINDINGS: MRI HEAD FINDINGS  There is an acute right paramedian midbrain infarct involving the periaqueductal gray matter. This measures approximately 4 x 7 mm. No other acute infarcts are seen. Remote right greater than left subcentimeter infarcts in the cerebellar hemispheres suggest previous posterior circulation ischemia. No mass lesion, hydrocephalus, or extra-axial fluid.  Normal cerebral volume. No white matter disease. Heterogeneity of the bone marrow in the clivus and upper cervical region, with loss of the normal T1 hyperintense marrow, likely related to the patient's anemia. Smoking or chronic disease could have a similar appearance.  No acute sinus or mastoid disease. Right middle turbinate concha bullosa. Negative orbits.  Compared today's earlier CT, the infarct is not visible. Compared to prior MR, the infarct was not present.  MRA HEAD FINDINGS  The internal carotid arteries are widely patent. The basilar artery is widely patent with vertebrals codominant. Bilateral fetal PCA origins. No proximal stenosis or vascular occlusion. No intracranial aneurysm.  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 No other acute infarcts are seen.  Evidence for chronic bilateral cerebellar infarcts.  Unremarkable MRA intracranial.   Electronically Signed   By: Davonna Belling M.D.   On: 02/06/2014 21:39   Mr Brain Wo Contrast  02/06/2014    CLINICAL DATA:  New onset of visual difficulty, and lightheadedness. Diplopia.  EXAM: MRI HEAD WITHOUT CONTRAST  MRA HEAD WITHOUT CONTRAST  TECHNIQUE: Multiplanar, multiecho pulse sequences of the brain and surrounding structures were obtained without intravenous contrast. Angiographic images of the head were obtained using MRA technique without contrast.  COMPARISON:  CT head earlier in the day.  MR head 01/30/2010.  FINDINGS: MRI HEAD FINDINGS  There is an acute right paramedian midbrain infarct involving the periaqueductal gray matter. This measures approximately 4 x 7 mm. No other acute infarcts are seen. Remote right greater than left subcentimeter infarcts in the cerebellar hemispheres suggest previous posterior circulation ischemia. No mass lesion, hydrocephalus, or extra-axial fluid.  Normal cerebral volume. No white matter disease. Heterogeneity of the bone marrow in  the clivus and upper cervical region, with loss of the normal T1 hyperintense marrow, likely related to the patient's anemia. Smoking or chronic disease could have a similar appearance.  No acute sinus or mastoid disease. Right middle turbinate concha bullosa. Negative orbits.  Compared today's earlier CT, the infarct is not visible. Compared to prior MR, the infarct was not present.  MRA HEAD FINDINGS  The internal carotid arteries are widely patent. The basilar artery is widely patent with vertebrals codominant. Bilateral fetal PCA origins. No proximal stenosis or vascular occlusion. No intracranial aneurysm.  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 No other acute infarcts are seen.  Evidence for chronic bilateral cerebellar infarcts.  Unremarkable MRA intracranial.   Electronically Signed   By: Davonna Belling M.D.   On: 02/06/2014 21:39    CT of the brain    MRI of the brain  Acute right paramedian midbrain infarct involving the periaqueductal  gray matter. No other acute infarcts are  seen.  Evidence for chronic bilateral cerebellar infarcts.   MRA of the brain  Unremarkable MRA intracranial.   Carotid Doppler  No hemodynamic stenosis 2D Echocardiogram  No PFO but Atrial septal aneurism  CXR    EKG sinius Therapy Recommendations pending  Physical Exam   Mental Status:  Alert, oriented, moderately anxious. Speech fluent without evidence of aphasia. Able to follow commands without difficulty.  Cranial Nerves:  II-Visual fields were normal. Patient was able to read without difficulty, as well as recognize pictures of common objects.  III/IV/VI-Pupils were equal and reacted. Extraocular movements were full and conjugate.  V/VII-no facial numbness and no facial weakness.  VIII-normal.  X-normal speech.  Motor: 5/5 bilaterally with normal tone and bulk  Sensory: Normal throughout.  Deep Tendon Reflexes: 2+ and symmetric.  Plantars: Mute bilaterally  Cerebellar: Normal finger-to-nose testing.   ASSESSMENT [name] is a 33 y.o. female  with Past medical history of TIA, PFO.  Patient presented with visual disturbances. She mentions that she has history of a TIA left-sided weakness 4 years ago. Vision improved but not at baseline.   Pt is s/p 2decho, but it was not bubble study     LDL 68     Hospital day # 2  TREATMENT/PLAN  Continue ASA as pt was not on it at home  Hypercoagulable profile was sent yesterday  2decho  With bubble. Last one done 4 yrs ago with PFO, if again seen would consult cardiology for possible closure as pt has strokes in multiple vascular territories.  2decho done but apparently it was not bubble study. No evidence of PFO seen there but ? Atrial septal aneurism.   Negative LE for any DVT  I think due to strokes in multiple vascular territories pt would benefit from TEE and loop recorder as out pt.   D/w pt and pt's husband at bedside.    SIGNED Pauletta Browns   I, the attending vascular neurologist, have  personally obtained a history, examined the patient, evaluated laboratory data and imaging studies, and formulated the assessment and plan of care.  I have made any additions or clarifications directly to the above note and agree with the findings and plan as currently documented.    To contact Stroke Continuity provider, please refer to [email or web address]. After hours, contact General Neurology

## 2014-02-09 ENCOUNTER — Encounter (HOSPITAL_COMMUNITY): Payer: Self-pay | Admitting: Pharmacy Technician

## 2014-02-09 ENCOUNTER — Other Ambulatory Visit: Payer: Self-pay | Admitting: Physician Assistant

## 2014-02-09 DIAGNOSIS — I639 Cerebral infarction, unspecified: Secondary | ICD-10-CM

## 2014-02-09 LAB — LUPUS ANTICOAGULANT PANEL
DRVVT: 44.1 secs — ABNORMAL HIGH (ref <42.9)
LUPUS ANTICOAGULANT: NOT DETECTED
PTT LA: 34.5 s (ref 28.0–43.0)
dRVVT Incubated 1:1 Mix: 39.3 secs (ref <42.9)

## 2014-02-09 LAB — RHEUMATOID FACTOR

## 2014-02-09 LAB — CARDIOLIPIN ANTIBODIES, IGG, IGM, IGA
Anticardiolipin IgA: 7 APL U/mL — ABNORMAL LOW (ref <22)
Anticardiolipin IgG: 8 GPL U/mL — ABNORMAL LOW (ref <23)
Anticardiolipin IgM: 8 MPL U/mL — ABNORMAL LOW (ref <11)

## 2014-02-09 LAB — PROTEIN C ACTIVITY: Protein C Activity: 171 % — ABNORMAL HIGH (ref 75–133)

## 2014-02-09 LAB — PROTEIN C, TOTAL: Protein C, Total: 83 % (ref 72–160)

## 2014-02-09 LAB — ANA: Anti Nuclear Antibody(ANA): POSITIVE — AB

## 2014-02-09 LAB — ANTI-NUCLEAR AB-TITER (ANA TITER): ANA Titer 1: 1:80 {titer} — ABNORMAL HIGH

## 2014-02-09 LAB — PROTEIN S ACTIVITY: PROTEIN S ACTIVITY: 85 % (ref 69–129)

## 2014-02-09 LAB — BETA-2-GLYCOPROTEIN I ABS, IGG/M/A
BETA-2-GLYCOPROTEIN I IGA: 18 A Units (ref <20)
Beta-2 Glyco I IgG: 0 G Units (ref <20)
Beta-2-Glycoprotein I IgM: 15 M Units (ref <20)

## 2014-02-09 LAB — PROTEIN S, TOTAL: PROTEIN S AG TOTAL: 94 % (ref 60–150)

## 2014-02-09 MED ORDER — ASPIRIN 81 MG PO TBEC: 81.0000 mg | DELAYED_RELEASE_TABLET | Freq: Every day | ORAL | Status: DC

## 2014-02-09 NOTE — Progress Notes (Signed)
Stroke Team Progress Note  HISTORY 33 y.o. female with a history of TIA in 2011, and anxiety, presenting with new onset visual changes which she describes as inability to focus, with associated lightheadedness which is worse on looking to the left side. At one point she complained of diplopia. She's had no symptoms of this nature previously. TIA consisted of transient weakness involving left side of her face and left upper extremity which resolved in less than 10 minutes. Patient has not been on antiplatelet therapy. Symptom onset today was at 2:30 PM. CT scan of the head showed no acute intracranial abnormality. Patient was beyond time window for consideration for TPA, in addition to having no clear focal deficits on examination.  SUBJECTIVE huband is at the bedside. Patient able to supply neuro history details. She has multiple questions.  OBJECTIVE Most recent Vital Signs: Filed Vitals:   02/08/14 2000 02/08/14 2342 02/09/14 0400 02/09/14 0800  BP: 125/77 105/61 115/67 114/72  Pulse: 85 78 73 82  Temp: 98.3 F (36.8 C) 98.4 F (36.9 C) 97.4 F (36.3 C) 98 F (36.7 C)  TempSrc:    Oral  Resp: $Remo'18 16 16 18  'ZQvmw$ Height:      Weight:      SpO2: 99% 98% 98% 97%   CBG (last 3)   Recent Labs  02/06/14 2003  GLUCAP 107*    IV Fluid Intake:     MEDICATIONS  . aspirin EC  81 mg Oral Daily  . cholecalciferol  5,000 Units Oral q morning - 10a  . enoxaparin (LOVENOX) injection  40 mg Subcutaneous Daily  . ferrous sulfate  325 mg Oral Q breakfast   PRN:  acetaminophen, ALPRAZolam, senna-docusate  Diet:  Cardiac thin liquids Activity:  Up with assistance DVT Prophylaxis:  SCDs  CLINICALLY SIGNIFICANT STUDIES Basic Metabolic Panel:   Recent Labs Lab 02/06/14 1952 02/06/14 2008 02/07/14 0355  NA 137 139 142  K 4.2 4.0 4.2  CL 100 106 105  CO2 22  --  26  GLUCOSE 107* 109* 101*  BUN $Re'9 8 8  'zLp$ CREATININE 0.65 0.60 0.65  CALCIUM 9.0  --  8.7   Liver Function Tests:   Recent  Labs Lab 02/06/14 1952 02/07/14 0355  AST 21 17  ALT 20 17  ALKPHOS 60 56  BILITOT <0.2* <0.2*  PROT 7.6 6.9  ALBUMIN 3.7 3.3*   CBC:   Recent Labs Lab 02/06/14 1952 02/06/14 2008 02/07/14 0355  WBC 8.0  --  6.7  NEUTROABS 4.1  --  2.5  HGB 10.5* 12.9 10.5*  HCT 34.0* 38.0 34.6*  MCV 74.9*  --  77.1*  PLT 346  --  334   Coagulation:   Recent Labs Lab 02/06/14 1952  LABPROT 13.0  INR 0.98   Cardiac Enzymes: No results found for this basename: CKTOTAL, CKMB, CKMBINDEX, TROPONINI,  in the last 168 hours Urinalysis:   Recent Labs Lab 02/06/14 2020  COLORURINE YELLOW  LABSPEC 1.007  PHURINE 7.0  GLUCOSEU NEGATIVE  HGBUR NEGATIVE  BILIRUBINUR NEGATIVE  KETONESUR NEGATIVE  PROTEINUR NEGATIVE  UROBILINOGEN 0.2  NITRITE NEGATIVE  LEUKOCYTESUR NEGATIVE   Lipid Panel    Component Value Date/Time   CHOL 128 02/07/2014 0355   TRIG 106 02/07/2014 0355   HDL 39* 02/07/2014 0355   CHOLHDL 3.3 02/07/2014 0355   VLDL 21 02/07/2014 0355   LDLCALC 68 02/07/2014 0355   HgbA1C  Lab Results  Component Value Date   HGBA1C 5.6 02/07/2014  Urine Drug Screen:     Component Value Date/Time   LABOPIA NONE DETECTED 02/06/2014 2020   COCAINSCRNUR NONE DETECTED 02/06/2014 2020   LABBENZ NONE DETECTED 02/06/2014 2020   AMPHETMU NONE DETECTED 02/06/2014 2020   THCU NONE DETECTED 02/06/2014 2020   LABBARB NONE DETECTED 02/06/2014 2020    Alcohol Level:   Recent Labs Lab 02/06/14 1952  ETH <11    No results found.  CT of the brain    MRI of the brain  Acute right paramedian midbrain infarct involving the periaqueductal gray matter. No other acute infarcts are seen. Evidence for chronic bilateral cerebellar infarcts.  MRA of the brain  Unremarkable MRA intracranial.  Carotid Doppler  No hemodynamic stenosis  2D Echocardiogram  No PFO but Atrial septal aneurism  TEE - scheduled as an OP  EKG sinus  Therapy Recommendations no therapy needs  Physical Exam   Mental  Status:  Alert, oriented, moderately anxious. Speech fluent without evidence of aphasia. Able to follow commands without difficulty.  Cranial Nerves:  II-Visual fields were normal. Patient was able to read without difficulty, as well as recognize pictures of common objects.  III/IV/VI-Pupils were equal and reacted. Extraocular movements were full and conjugate.  V/VII-no facial numbness and no facial weakness.  VIII-normal.  X-normal speech.  Motor: 5/5 bilaterally with normal tone and bulk  Sensory: Normal throughout.  Deep Tendon Reflexes: 2+ and symmetric.  Plantars: Mute bilaterally  Cerebellar: Normal finger-to-nose testing.   ASSESSMENT [name] is a 33 y.o. female  with hx TIA with left arm HP and left facial weakness x 5 min, then resolved, 4 years ago, OP workup with Dr. Leta Baptist found a PFO. 4 weeks ago, pt presented with left arm numbness and was d/c home from ED with dx hyperventilation. This admission, pt presented with visual disturbances. MRI confirms a R paramedian midbrain infarct.  She was supposed to be on daily aspirin, but was not taking. Husband reports she snores.Negative LE for any DVT. She does report hx heart racing after daughter born in 2013 which was treated with alprazolam.    LDL 68  Obesity, Body mass index is 37.06 kg/(m^2).   ANA positive. ESR 28  Hospital day # 3  TREATMENT/PLAN  Ok for discharge home today  May return to work in 1 week  Continue ASA 81 mg daily  F/u Hypercoagulable profile   Added vasculitic labs to be drawn prior to d/c today  OP TEE scheduled for Wed  Outpatient TCD bubble study with emboli monitoring with Dr. Leonie Man in 1 month to further evaluate PFO. Patient to call for appointment. (I have added to discharge instruction sheet). After that vision, she will transition back to Dr. Leta Baptist for her neuro care.  OP eval for obstructive sleep apnea   D/w pt and pt's husband at bedside.    SIGNED Burnetta Sabin, MSN, RN, ANVP-BC, ANP-BC, GNP-BC [facility] Pager: [phone] 02/09/2014 11:31 AM   I, the attending vascular neurologist, have personally obtained a history, examined the patient, evaluated laboratory data and imaging studies, and formulated the assessment and plan of care.  I have made any additions or clarifications directly to the above note and agree with the findings and plan as currently documented.    To contact Stroke Continuity provider, please refer to [email or web address] After hours, contact General Neurology

## 2014-02-09 NOTE — Progress Notes (Signed)
    [facility] has been requested to perform a transesophageal echocardiogram on Ms. Reading for stroke. I clarified with Dr. Pearlean BrownieSethi that we do in fact want a TEE. Since test is unable to be accomodated on schedule today, he said to let IM know that patient is appropriate for discharge to return as an outpatient for this. I spoke with Dr. Cena BentonVega to inform him of this recommendation.   Discussed test with patient. After careful review of history and examination, the risks and benefits of transesophageal echocardiogram have been explained including risks of esophageal damage, perforation (1:10,000 risk), bleeding, pharyngeal hematoma as well as other potential complications associated with conscious sedation including aspiration, arrhythmia, respiratory failure and death. Alternatives to treatment were discussed, questions were answered. Patient is willing to proceed.   Test is scheduled for 02/11/14 at 10am, pt to arrive at 8:45am - check in at the [facility] admitting. Nothing to eat or drink after midnight the night before except sips with meds are OK. EP team has also been notified of need to consult for ILR that day as well.  Ronie Spiesayna Dunn, PA-C  02/09/2014 8:25 AM

## 2014-02-09 NOTE — Care Management Note (Signed)
    Page 1 of 1   02/09/2014     11:48:06 AM CARE MANAGEMENT NOTE 02/09/2014  Patient:  [name],Marissa T   Account Number:  [id number]  Date Initiated:  02/09/2014  Documentation initiated by:  GRAVES-BIGELOW,Shalaina Guardiola  Subjective/Objective Assessment:   Pt admitted for vision problems and MRI + for CVA.     Action/Plan:   No needs from CM at this time.   Anticipated DC Date:  02/09/2014   Anticipated DC Plan:  HOME/SELF CARE      DC Planning Services  CM consult      Choice offered to / List presented to:             Status of service:  Completed, signed off Medicare Important Message given?  NO (If response is "NO", the following Medicare IM given date fields will be blank) Date Medicare IM given:   Medicare IM given by:   Date Additional Medicare IM given:   Additional Medicare IM given by:    Discharge Disposition:  HOME/SELF CARE  Per UR Regulation:  Reviewed for med. necessity/level of care/duration of stay  If discussed at Long Length of Stay Meetings, dates discussed:    Comments:

## 2014-02-09 NOTE — Progress Notes (Signed)
UR Completed Koraima Albertsen Graves-Bigelow, RN,BSN [phone]  

## 2014-02-09 NOTE — Discharge Instructions (Signed)
Ischemic Stroke A stroke (cerebrovascular accident) is the sudden death of brain tissue. It is a medical emergency. A stroke can cause permanent loss of brain function. This can cause problems with different parts of your body. A transient ischemic attack (TIA) is different because it does not cause permanent damage. A TIA is a short-lived problem of poor blood flow affecting a part of the brain. A TIA is also a serious problem because having a TIA greatly increases the chances of having a stroke. When symptoms first develop, you cannot know if the problem might be a stroke or TIA. CAUSES  A stroke is caused by a decrease of oxygen supply to an area of your brain. It is usually the result of a small blood clot or collection of cholesterol or fat (plaque) that blocks blood flow in the brain. A stroke can also be caused by blocked or damaged carotid arteries.  RISK FACTORS  High blood pressure (hypertension).  High cholesterol.  Diabetes mellitus.  Heart disease.  The build up of plaque in the blood vessels (peripheral artery disease or atherosclerosis).  The build up of plaque in the blood vessels providing blood and oxygen to the brain (carotid artery stenosis).  An abnormal heart rhythm (atrial fibrillation).  Obesity.  Smoking.  Taking oral contraceptives (especially in combination with smoking).  Physical inactivity.  A diet high in fats, salt (sodium), and calories.  Alcohol use.  Use of illegal drugs (especially cocaine and methamphetamine).  Being African American.  Being over the age of 55.  Family history of stroke.  Previous history of blood clots, stroke, TIA, or heart attack.  Sickle cell disease. SYMPTOMS  These symptoms usually develop suddenly, or may be newly present upon awakening from sleep:  Sudden weakness or numbness of the face, arm, or leg, especially on one side of the body.  Sudden trouble walking or difficulty moving arms or legs.  Sudden  confusion.  Sudden personality changes.  Trouble speaking (aphasia) or understanding.  Difficulty swallowing.  Sudden trouble seeing in one or both eyes.  Double vision.  Dizziness.  Loss of balance or coordination.  Sudden severe headache with no known cause.  Trouble reading or writing. DIAGNOSIS  Your health care provider can often determine the presence or absence of a stroke based on your symptoms, history, and physical exam. Computed tomography (CT) of the brain is usually performed to confirm the stroke, determine causes, and determine stroke severity. Other tests may be done to find the cause of the stroke. These tests may include:  Electrocardiography.  Continuous heart monitoring.  Echocardiography.  Carotid ultrasonography.  Magnetic resonance imaging (MRI).  A scan of the brain circulation.  Blood tests. PREVENTION  The risk of a stroke can be decreased by appropriately treating high blood pressure, high cholesterol, diabetes, heart disease, and obesity and by quitting smoking, limiting alcohol, and staying physically active. TREATMENT  Time is of the essence. It is important to seek treatment at the first sign of these symptoms because you may receive a medicine to dissolve the clot (thrombolytic) that cannot be given if too much time has passed since your symptoms began. Even if you do not know when your symptoms began, get treatment as soon as possible, as there are other treatment options available including oxygen, intravenous (IV) fluids, and medicines to thin the blood (anticoagulants). Treatment of stroke depends on the duration, severity, and cause of your symptoms. Medicines and diet may be used to address diabetes, high  blood pressure, and other risk factors. Physical, speech, and occupational therapists will assess you and work to improve any functions impaired by the stroke. Measures will be taken to prevent short-term and long-term complications,  including infection from breathing foreign material into the lungs (aspiration pneumonia), blood clots in the legs, bedsores, and falls. Rarely, surgery may be needed to remove large blood clots or to open up blocked arteries. HOME CARE INSTRUCTIONS   Take all medicines prescribed by your health care provider. Follow the directions carefully. Medicines may be used to control risk factors for a stroke. Be sure you understand all your medicine instructions.  You may be told to take a medicine to thin the blood, such as aspirin or the anticoagulant warfarin. Warfarin needs to be taken exactly as instructed.  Too much and too little warfarin are both dangerous. Too much warfarin increases the risk of bleeding. Too little warfarin continues to allow the risk for blood clots. While taking warfarin, you will need to have regular blood tests to measure your blood clotting time. These blood tests usually include both the PT and INR tests. The PT and INR results allow your health care provider to adjust your dose of warfarin. The dose can change for many reasons. It is critically important that you take warfarin exactly as prescribed, and that you have your PT and INR levels drawn exactly as directed.  Many foods, especially foods high in vitamin K can interfere with warfarin and affect the PT and INR results. Foods high in vitamin K include spinach, kale, broccoli, cabbage, collard and turnip greens, brussels sprouts, peas, cauliflower, seaweed, and parsley as well as beef and pork liver, green tea, and soybean oil. You should eat a consistent amount of foods high in vitamin K. Avoid major changes in your diet, or notify your health care provider before changing your diet. Arrange a visit with a dietitian to answer your questions.  Many medicines can interfere with warfarin and affect the PT and INR results. You must tell your health care provider about any and all medicines you take, this includes all vitamins  and supplements. Be especially cautious with aspirin and anti-inflammatory medicines. Do not take or discontinue any prescribed or over-the-counter medicine except on the advice of your health care provider or pharmacist.  Warfarin can have side effects, such as excessive bruising or bleeding. You will need to hold pressure over cuts for longer than usual. Your health care provider or pharmacist will discuss other potential side effects.  Avoid sports or activities that may cause injury or bleeding.  Be mindful when shaving, flossing your teeth, or handling sharp objects.  Alcohol can change the body's ability to handle warfarin. It is best to avoid alcoholic drinks or consume only very small amounts while taking warfarin. Notify your health care provider if you change your alcohol intake.  Notify your dentist or other health care providers before procedures.  If swallow studies have determined that your swallowing reflex is present, you should eat healthy foods. A diet that includes 5 or more servings of fruits and vegetables a day may reduce the risk of stroke. Foods may need to be a special consistency (soft or pureed), or small bites may need to be taken in order to avoid aspirating or choking. Certain diets may be prescribed to address high blood pressure, high cholesterol, diabetes, or obesity.  A low-sodium, low-saturated fat, low-trans fat, low-cholesterol diet is recommended to manage high blood pressure.  A low-saturated fat, low-trans  fat, low-cholesterol, and high-fiber diet may control cholesterol levels.  A controlled-carbohydrate, controlled-sugar diet is recommended to manage diabetes.  A reduced-calorie, low-sodium, low-saturated fat, low-trans fat, low-cholesterol diet is recommended to manage obesity.  Maintain a healthy weight.  Stay physically active. It is recommended that you get at least 30 minutes of activity on most or all days.  Do not use any tobacco products  including cigarettes, chewing tobacco, or electronic cigarettes.  Limit alcohol use even if you are not taking warfarin. Moderate alcohol use is considered to be:  No more than 2 drinks each day for men.  No more than 1 drink each day for nonpregnant women..  Home safety. A safe home environment is important to reduce the risk of falls. Your health care provider may arrange for specialists to evaluate your home. Having grab bars in the bedroom and bathroom is often important. Your health care provider may arrange for equipment to be used at home, such as raised toilets and a seat for the shower.  Physical, occupational, and speech therapy. Ongoing therapy may be needed to maximize your recovery after a stroke. If you have been advised to use a walker or a cane, use it at all times. Be sure to keep your therapy appointments.  Follow all instructions for follow-up with your health care provider. This is very important. This includes any referrals, physical therapy, rehabilitation, and lab tests. Proper follow up can prevent another stroke from occurring. SEEK MEDICAL CARE IF:  You have personality changes.  You have difficulty swallowing.  You are seeing double.  You have dizziness.  You have a fever.  You have skin breakdown. SEEK IMMEDIATE MEDICAL CARE IF:  Any of these symptoms may represent a serious problem that is an emergency. Do not wait to see if the symptoms will go away. Get medical help right away. Call your local emergency services (911 in [place] Do not drive yourself to the hospital.  You have sudden weakness or numbness of the face, arm, or leg, especially on one side of the body.  You have sudden trouble walking or difficulty moving arms or legs.  You have sudden confusion.  You have trouble speaking (aphasia) or understanding.  You have sudden trouble seeing in one or both eyes.  You have a loss of balance or coordination.  You have a sudden, severe headache  with no known cause.  You have new chest pain or an irregular heartbeat.  You have a partial or total loss of consciousness.   Document Released: 07/17/2005 Document Revised: 07/22/2013 Document Reviewed: 02/25/2012 [facility] Patient Information 2015 [place], [place]. This information is not intended to replace advice given to you by your health care provider. Make sure you discuss any questions you have with your health care provider.   Stroke Prevention Some health problems and behaviors may make it more likely for you to have a stroke. Below are ways to lessen your risk of having a stroke.   Be active for at least 30 minutes on most or all days.  Do not smoke. Try not to be around others who smoke.  Do not drink too much alcohol.  Do not have more than 2 drinks a day if you are a man.  Do not have more than 1 drink a day if you are a woman and are not pregnant.  Eat healthy foods, such as fruits and vegetables. If you were put on a specific diet, follow the diet as told.  Keep your  cholesterol levels under control through diet and medicines. Look for foods that are low in saturated fat, trans fat, cholesterol, and are high in fiber.  If you have diabetes, follow all diet plans and take your medicine as told.  If you have high blood pressure (hypertension), follow all diet plans and take your medicine as told.  Keep a healthy weight. Eat foods that are low in calories, salt, saturated fat, trans fat, and cholesterol.  Do not take drugs.  Avoid birth control pills, if this applies. Talk to your doctor about the risks of taking birth control pills.  Talk to your doctor if you have sleep problems (sleep apnea).  Take all medicine as told by your doctor.  You may be told to take aspirin or blood thinner medicine. Take this medicine as told by your doctor.  Understand your medicine instructions.  Make sure any other conditions you have are being taken care of. GET HELP RIGHT  AWAY IF:  You suddenly lose feeling (you feel numb) or have weakness in your face, arm, or leg.  Your face or eyelid hangs down to one side.  You suddenly feel confused.  You have trouble talking (aphasia) or understanding what people are saying.  You suddenly have trouble seeing in one or both eyes.  You suddenly have trouble walking.  You are dizzy.  You lose your balance or your movements are clumsy (uncoordinated).  You suddenly have a very bad headache and you do not know the cause.  You have new chest pain.  Your heart feels like it is fluttering or skipping a beat (irregular heartbeat). Do not wait to see if the symptoms above go away. Get help right away. Call your local emergency services (911 in [place] Do not drive yourself to the hospital. Document Released: 01/16/2012 Document Revised: 05/07/2013 Document Reviewed: 01/17/2013 [facility] Patient Information 2015 [place], [place]. This information is not intended to replace advice given to you by your health care provider. Make sure you discuss any questions you have with your health care provider.   STROKE/TIA DISCHARGE INSTRUCTIONS SMOKING Cigarette smoking nearly doubles your risk of having a stroke & is the single most alterable risk factor  If you smoke or have smoked in the last 12 months, you are advised to quit smoking for your health.  Most of the excess cardiovascular risk related to smoking disappears within a year of stopping.  Ask you doctor about anti-smoking medications  [place] Quit Line: 1-800-QUIT NOW  Free Smoking Cessation Classes [phone]  CHOLESTEROL Know your levels; limit fat & cholesterol in your diet  Lipid Panel     Component Value Date/Time   CHOL 128 02/07/2014 0355   TRIG 106 02/07/2014 0355   HDL 39* 02/07/2014 0355   CHOLHDL 3.3 02/07/2014 0355   VLDL 21 02/07/2014 0355   LDLCALC 68 02/07/2014 0355      Many patients benefit from treatment even if their cholesterol is at  goal.  Goal: Total Cholesterol (CHOL) less than 160  Goal:  Triglycerides (TRIG) less than 150  Goal:  HDL greater than 40  Goal:  LDL (LDLCALC) less than 100   BLOOD PRESSURE American Stroke Association blood pressure target is less that 120/80 mm/Hg  Your discharge blood pressure is:  BP: 116/71 mmHg  Monitor your blood pressure  Limit your salt and alcohol intake  Many individuals will require more than one medication for high blood pressure  DIABETES (A1c is a blood sugar average for last  3 months) Goal HGBA1c is under 7% (HBGA1c is blood sugar average for last 3 months)  Diabetes: No known diagnosis of diabetes    Lab Results  Component Value Date   HGBA1C 5.6 02/07/2014     Your HGBA1c can be lowered with medications, healthy diet, and exercise.  Check your blood sugar as directed by your physician  Call your physician if you experience unexplained or low blood sugars.  PHYSICAL ACTIVITY/REHABILITATION Goal is 30 minutes at least 4 days per week  Activity: No restrictions. Therapies: NONE Return to work:   Activity decreases your risk of heart attack and stroke and makes your heart stronger.  It helps control your weight and blood pressure; helps you relax and can improve your mood.  Participate in a regular exercise program.  Talk with your doctor about the best form of exercise for you (dancing, walking, swimming, cycling).  DIET/WEIGHT Goal is to maintain a healthy weight  Your discharge diet is: Sodium Restricted REGULAR liquids Your height is:  Height: 5\' 4"  (162.6 cm) Your current weight is: Weight: 97.977 kg (216 lb) Your Body Mass Index (BMI) is:  BMI (Calculated): 38.1  Following the type of diet specifically designed for you will help prevent another stroke.  Your goal weight range is: 111 - 140  Your goal Body Mass Index (BMI) is 19-24.  Healthy food habits can help reduce 3 risk factors for stroke:  High cholesterol, hypertension, and excess weight.   RESOURCES Stroke/Support Group:  Call [phone]   STROKE EDUCATION PROVIDED/REVIEWED AND GIVEN TO PATIENT Stroke warning signs and symptoms How to activate emergency medical system (call 911). Medications prescribed at discharge. Need for follow-up after discharge. Personal risk factors for stroke. Pneumonia vaccine given: No Flu vaccine given: No My questions have been answered, the writing is legible, and I understand these instructions.  I will adhere to these goals & educational materials that have been provided to me after my discharge from the hospital.   Low-Sodium Eating Plan Sodium raises blood pressure and causes water to be held in the body. Getting less sodium from food will help lower your blood pressure, reduce any swelling, and protect your heart, liver, and kidneys. We get sodium by adding salt (sodium chloride) to food. Most of our sodium comes from canned, boxed, and frozen foods. Restaurant foods, fast foods, and pizza are also very high in sodium. Even if you take medicine to lower your blood pressure or to reduce fluid in your body, getting less sodium from your food is important. WHAT IS MY PLAN? Most people should limit their sodium intake to 2,300 mg a day. Your health care provider recommends that you limit your sodium intake to __________ a day.  WHAT DO I NEED TO KNOW ABOUT THIS EATING PLAN? For the low-sodium eating plan, you will follow these general guidelines:  Choose foods with a % Daily Value for sodium of less than 5% (as listed on the food label).   Use salt-free seasonings or herbs instead of table salt or sea salt.   Check with your health care provider or pharmacist before using salt substitutes.   Eat fresh foods.  Eat more vegetables and fruits.  Limit canned vegetables. If you do use them, rinse them well to decrease the sodium.   Limit cheese to 1 oz (28 g) per day.   Eat lower-sodium products, often labeled as "lower sodium" or "no  salt added."  Avoid foods that contain monosodium glutamate (MSG). MSG is  sometimes added to [place] food and some canned foods.  Check food labels (Nutrition Facts labels) on foods to learn how much sodium is in one serving.  Eat more home-cooked food and less restaurant, buffet, and fast food.  When eating at a restaurant, ask that your food be prepared with less salt or none, if possible.  HOW DO I READ FOOD LABELS FOR SODIUM INFORMATION? The Nutrition Facts label lists the amount of sodium in one serving of the food. If you eat more than one serving, you must multiply the listed amount of sodium by the number of servings. Food labels may also identify foods as:  Sodium free--Less than 5 mg in a serving.  Very low sodium--35 mg or less in a serving.  Low sodium--140 mg or less in a serving.  Light in sodium--50% less sodium in a serving. For example, if a food that usually has 300 mg of sodium is changed to become light in sodium, it will have 150 mg of sodium.  Reduced sodium--25% less sodium in a serving. For example, if a food that usually has 400 mg of sodium is changed to reduced sodium, it will have 300 mg of sodium. WHAT FOODS CAN I EAT? Grains Low-sodium cereals, including oats, puffed wheat and rice, and shredded wheat cereals. Low-sodium crackers. Unsalted rice and pasta. Lower-sodium bread.  Vegetables Frozen or fresh vegetables. Low-sodium or reduced-sodium canned vegetables. Low-sodium or reduced-sodium tomato sauce and paste. Low-sodium or reduced-sodium tomato and vegetable juices.  Fruits Fresh, frozen, and canned fruit. Fruit juice.  Meat and Other Protein Products Low-sodium canned tuna and salmon. Fresh or frozen meat, poultry, seafood, and fish. Lamb. Unsalted nuts. Dried beans, peas, and lentils without added salt. Unsalted canned beans. Homemade soups without salt. Eggs.  Dairy Milk. Soy milk. Ricotta cheese. Low-sodium or reduced-sodium cheeses.  Yogurt.  Condiments Fresh and dried herbs and spices. Salt-free seasonings. Onion and garlic powders. Low-sodium varieties of mustard and ketchup. Lemon juice.  Fats and Oils Reduced-sodium salad dressings. Unsalted butter.  Other Unsalted popcorn and pretzels.  The items listed above may not be a complete list of recommended foods or beverages. Contact your dietitian for more options. WHAT FOODS ARE NOT RECOMMENDED? Grains Instant hot cereals. Bread stuffing, pancake, and biscuit mixes. Croutons. Seasoned rice or pasta mixes. Noodle soup cups. Boxed or frozen macaroni and cheese. Self-rising flour. Regular salted crackers. Vegetables Regular canned vegetables. Regular canned tomato sauce and paste. Regular tomato and vegetable juices. Frozen vegetables in sauces. Salted french fries. Olives. Rosita Fire. Relishes. Sauerkraut. Salsa. Meat and Other Protein Products Salted, canned, smoked, spiced, or pickled meats, seafood, or fish. Bacon, ham, sausage, hot dogs, corned beef, chipped beef, and packaged luncheon meats. Salt pork. Jerky. Pickled herring. Anchovies, regular canned tuna, and sardines. Salted nuts. Dairy Processed cheese and cheese spreads. Cheese curds. Blue cheese and cottage cheese. Buttermilk.  Condiments Onion and garlic salt, seasoned salt, table salt, and sea salt. Canned and packaged gravies. Worcestershire sauce. Tartar sauce. Barbecue sauce. Teriyaki sauce. Soy sauce, including reduced sodium. Steak sauce. Fish sauce. Oyster sauce. Cocktail sauce. Horseradish. Regular ketchup and mustard. Meat flavorings and tenderizers. Bouillon cubes. Hot sauce. Tabasco sauce. Marinades. Taco seasonings. Relishes. Fats and Oils Regular salad dressings. Salted butter. Margarine. Ghee. Bacon fat.  Other Potato and tortilla chips. Corn chips and puffs. Salted popcorn and pretzels. Canned or dried soups. Pizza. Frozen entrees and pot pies.  The items listed above may not be a  complete list of foods and  beverages to avoid. Contact your dietitian for more information. Document Released: 01/06/2002 Document Revised: 07/22/2013 Document Reviewed: 05/21/2013 [facility] Patient Information 2015 [place], [place]. This information is not intended to replace advice given to you by your health care provider. Make sure you discuss any questions you have with your health care provider.

## 2014-02-09 NOTE — Discharge Summary (Signed)
Physician Discharge Summary  [name] XWR:[id number] DOB: 12-04-1980 DOA: 02/06/2014  PCP: Johny Blamer, MD  Admit date: 02/06/2014 Discharge date: 02/09/2014  Time spent: > 35 minutes  Recommendations for Outpatient Follow-up:  1. Please followup with cardiologist for TEE and loop recorder insertion. 2. Followup with neurologist in 2 3. Aspirin was added to patient's home regimen.  Discharge Diagnoses:  Principal Problem:   Acute CVA (cerebrovascular accident) Active Problems:   Microcytic anemia   Discharge Condition: stable  Diet recommendation: Low sodium diet.  Filed Weights   02/06/14 2300 02/08/14 0500  Weight: 100.472 kg (221 lb 8 oz) 97.977 kg (216 lb)    History of present illness:  33 y/o with past medical history of TIA and PFO. Presented to the ED complaining of visual disturbances  Hospital Course:  Principal Problem:   Acute CVA (cerebrovascular accident) - Neurology on board - work up results listed below - recommendations are for further workup with TEE and loop recorder - Discharge on aspirin and followup with cardiologist for above procedures and neurology within the next one to 2 weeks post discharge  Active Problems:   Microcytic anemia - Secondary to iron deficiency of which patient is currently on iron supplementation at home. We'll continue home regimen  Procedures: CT head: Negative exam per radiologist interpretation. MRI of the brain Acute right paramedian midbrain infarct involving the periaqueductal  gray matter. No other acute infarcts are seen.  Evidence for chronic bilateral cerebellar infarcts.  MRA of the brain Unremarkable MRA intracranial.  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 If you have any questions about your discharge medications or the care you received while you were in the hospital after you are discharged, you can call the unit and asked to speak with the hospitalist on call if the hospitalist that took care of you is not available. Once you are discharged, your primary care physician will handle any further medical issues. Please note that NO REFILLS for any discharge medications will be authorized once you are discharged, as it is imperative that you return to your primary care physician (or establish a relationship with a primary care physician if you do not have one) for your aftercare needs so that they can reassess your need for medications and monitor your lab values.  Discharge Instructions   Call MD for:  difficulty breathing, headache or visual disturbances    Complete by:  As directed      Call MD for:  persistant dizziness or light-headedness    Complete by:  As directed      Call MD for:  temperature >100.4    Complete by:  As directed      Diet - low sodium heart healthy    Complete by:  As directed      Discharge instructions    Complete by:  As directed   Followup with neurology post discharge within the next one to 2 weeks     Increase activity slowly    Complete by:  As directed  Medication List         acetaminophen 500 MG tablet  Commonly known as:  TYLENOL  Take 1,000 mg by mouth every 6 (six) hours as needed for moderate pain.     ALPRAZolam 0.5 MG tablet  Commonly known as:  XANAX  Take 0.5 mg by mouth 3 (three) times daily as needed for anxiety.     aspirin 81 MG EC tablet  Take 1 tablet (81 mg total) by mouth daily.     ferrous sulfate 325 (65 FE) MG EC tablet  Take 325 mg by mouth daily with  breakfast.     multivitamin with minerals Tabs tablet  Take 1 tablet by mouth every morning.     Vitamin D3 5000 UNITS Tabs  Take 5,000 Units by mouth every morning.       Allergies  Allergen Reactions  . Strawberry Shortness Of Breath and Swelling    Tongue swelling  . Watermelon [Citrullus Vulgaris] Shortness Of Breath and Swelling  . Citalopram Hydrobromide Other (See Comments)    Was ineffective for patient  . Erythromycin Nausea And Vomiting  . Penicillins Hives, Itching and Rash       Follow-up Information   [facility]. (Wednesday 02/11/14 - arrive at 8:45am for 10am case. Nothing to eat or drink after midnight the night before (except sips with medications). )       Follow up with SETHI,PRAMOD, MD In 1 month. (Stroke Clinic, ask for a TCD with emboli monitoring, you will see Dr. Pearlean BrownieSethi at that time)    Specialties:  Neurology, Radiology   Contact information:   [address] [place] [id number] [phone]        The results of significant diagnostics from this hospitalization (including imaging, microbiology, ancillary and laboratory) are listed below for reference.    Significant Diagnostic Studies: Ct Head Wo Contrast  02/06/2014   CLINICAL DATA:  Code stroke.  EXAM: CT HEAD WITHOUT CONTRAST  TECHNIQUE: Contiguous axial images were obtained from the base of the skull through the vertex without intravenous contrast.  COMPARISON:  MRI 02/09/2010.  CT 02/07/2010.  FINDINGS: No mass. No hydrocephalus. No hemorrhage. No acute bony abnormality.  IMPRESSION: Negative exam. These results were called by telephone at the time of interpretation on 02/06/2014 at 8:22 PM to Dr. Roseanne RenoStewart, who verbally acknowledged these results.   Electronically Signed   By: Maisie Fushomas  Register   On: 02/06/2014 20:24   Mr Maxine GlennMra Head Wo Contrast  02/06/2014   CLINICAL DATA:  New onset of visual  difficulty, and lightheadedness. Diplopia.  EXAM: MRI HEAD WITHOUT CONTRAST  MRA HEAD WITHOUT CONTRAST  TECHNIQUE: Multiplanar, multiecho pulse sequences of the brain and surrounding structures were obtained without intravenous contrast. Angiographic images of the head were obtained using MRA technique without contrast.  COMPARISON:  CT head earlier in the day.  MR head 01/30/2010.  FINDINGS: MRI HEAD FINDINGS  There is an acute right paramedian midbrain infarct involving the periaqueductal gray matter. This measures approximately 4 x 7 mm. No other acute infarcts are seen. Remote right greater than left subcentimeter infarcts in the cerebellar hemispheres suggest previous posterior circulation ischemia. No mass lesion, hydrocephalus, or extra-axial fluid.  Normal cerebral volume. No white matter disease. Heterogeneity of the bone marrow in the clivus and upper cervical region, with loss of the normal T1 hyperintense marrow, likely related to the patient's anemia. Smoking  or chronic disease could have a similar appearance.  No acute sinus or mastoid disease. Right middle turbinate concha bullosa. Negative orbits.  Compared today's earlier CT, the infarct is not visible. Compared to prior MR, the infarct was not present.  MRA HEAD FINDINGS  The internal carotid arteries are widely patent. The basilar artery is widely patent with vertebrals codominant. Bilateral fetal PCA origins. No proximal stenosis or vascular occlusion. No intracranial aneurysm.  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 No other acute infarcts are seen.  Evidence for chronic bilateral cerebellar infarcts.  Unremarkable MRA intracranial.   Electronically Signed   By: Davonna Belling M.D.   On: 02/06/2014 21:39   Mr Brain Wo Contrast  02/06/2014   CLINICAL DATA:  New onset of visual difficulty, and lightheadedness. Diplopia.  EXAM: MRI HEAD WITHOUT CONTRAST  MRA HEAD WITHOUT CONTRAST  TECHNIQUE:  Multiplanar, multiecho pulse sequences of the brain and surrounding structures were obtained without intravenous contrast. Angiographic images of the head were obtained using MRA technique without contrast.  COMPARISON:  CT head earlier in the day.  MR head 01/30/2010.  FINDINGS: MRI HEAD FINDINGS  There is an acute right paramedian midbrain infarct involving the periaqueductal gray matter. This measures approximately 4 x 7 mm. No other acute infarcts are seen. Remote right greater than left subcentimeter infarcts in the cerebellar hemispheres suggest previous posterior circulation ischemia. No mass lesion, hydrocephalus, or extra-axial fluid.  Normal cerebral volume. No white matter disease. Heterogeneity of the bone marrow in the clivus and upper cervical region, with loss of the normal T1 hyperintense marrow, likely related to the patient's anemia. Smoking or chronic disease could have a similar appearance.  No acute sinus or mastoid disease. Right middle turbinate concha bullosa. Negative orbits.  Compared today's earlier CT, the infarct is not visible. Compared to prior MR, the infarct was not present.  MRA HEAD FINDINGS  The internal carotid arteries are widely patent. The basilar artery is widely patent with vertebrals codominant. Bilateral fetal PCA origins. No proximal stenosis or vascular occlusion. No intracranial aneurysm.  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 No other acute infarcts are seen.  Evidence for chronic bilateral cerebellar infarcts.  Unremarkable MRA intracranial.   Electronically Signed   By: Davonna Belling M.D.   On: 02/06/2014 21:39    Microbiology: No results found for this or any previous visit (from the past 240 hour(s)).   Labs: Basic Metabolic Panel:  Recent Labs Lab 02/06/14 1952 02/06/14 2008 02/07/14 0355  NA 137 139 142  K 4.2 4.0 4.2  CL 100 106 105  CO2 22  --  26  GLUCOSE 107* 109* 101*  BUN 9 8 8    CREATININE 0.65 0.60 0.65  CALCIUM 9.0  --  8.7   Liver Function Tests:  Recent Labs Lab 02/06/14 1952 02/07/14 0355  AST 21 17  ALT 20 17  ALKPHOS 60 56  BILITOT <0.2* <0.2*  PROT 7.6 6.9  ALBUMIN 3.7 3.3*   No results found for this basename: LIPASE, AMYLASE,  in the last 168 hours No results found for this basename: AMMONIA,  in the last 168 hours CBC:  Recent Labs Lab 02/06/14 1952 02/06/14 2008 02/07/14 0355  WBC 8.0  --  6.7  NEUTROABS 4.1  --  2.5  HGB 10.5* 12.9 10.5*  HCT 34.0* 38.0 34.6*  MCV 74.9*  --  77.1*  PLT 346  --  334  Cardiac Enzymes: No results found for this basename: CKTOTAL, CKMB, CKMBINDEX, TROPONINI,  in the last 168 hours BNP: BNP (last 3 results) No results found for this basename: PROBNP,  in the last 8760 hours CBG:  Recent Labs Lab 02/06/14 2003  GLUCAP 107*       Signed:  Penny Pia  [facility] 02/09/2014, 12:41 PM

## 2014-02-10 LAB — C3 COMPLEMENT: C3 COMPLEMENT: 161 mg/dL (ref 90–180)

## 2014-02-10 LAB — C4 COMPLEMENT: Complement C4, Body Fluid: 26 mg/dL (ref 10–40)

## 2014-02-11 ENCOUNTER — Encounter (HOSPITAL_COMMUNITY): Payer: Self-pay | Admitting: *Deleted

## 2014-02-11 ENCOUNTER — Ambulatory Visit (HOSPITAL_COMMUNITY)
Admission: AD | Admit: 2014-02-11 | Discharge: 2014-02-11 | Disposition: A | Discharge status: Home / Self Care | Payer: 59 | Source: Ambulatory Visit | Attending: Cardiovascular Disease | Admitting: Cardiovascular Disease

## 2014-02-11 ENCOUNTER — Encounter (HOSPITAL_COMMUNITY)
Admission: AD | Disposition: A | Discharge status: Home / Self Care | Payer: Self-pay | Source: Ambulatory Visit | Attending: Cardiovascular Disease

## 2014-02-11 DIAGNOSIS — I639 Cerebral infarction, unspecified: Secondary | ICD-10-CM

## 2014-02-11 DIAGNOSIS — Q211 Atrial septal defect: Secondary | ICD-10-CM | POA: Insufficient documentation

## 2014-02-11 DIAGNOSIS — Q2111 Secundum atrial septal defect: Secondary | ICD-10-CM | POA: Insufficient documentation

## 2014-02-11 DIAGNOSIS — I6350 Cerebral infarction due to unspecified occlusion or stenosis of unspecified cerebral artery: Secondary | ICD-10-CM

## 2014-02-11 DIAGNOSIS — F411 Generalized anxiety disorder: Secondary | ICD-10-CM | POA: Insufficient documentation

## 2014-02-11 DIAGNOSIS — Z8673 Personal history of transient ischemic attack (TIA), and cerebral infarction without residual deficits: Secondary | ICD-10-CM | POA: Insufficient documentation

## 2014-02-11 DIAGNOSIS — D509 Iron deficiency anemia, unspecified: Secondary | ICD-10-CM | POA: Insufficient documentation

## 2014-02-11 DIAGNOSIS — E559 Vitamin D deficiency, unspecified: Secondary | ICD-10-CM | POA: Insufficient documentation

## 2014-02-11 HISTORY — PX: TEE WITHOUT CARDIOVERSION: SHX5443

## 2014-02-11 LAB — COMPLEMENT, TOTAL

## 2014-02-11 SURGERY — ECHOCARDIOGRAM, TRANSESOPHAGEAL
Anesthesia: Moderate Sedation

## 2014-02-11 MED ORDER — MIDAZOLAM HCL 5 MG/ML IJ SOLN
INTRAMUSCULAR | Status: AC
  Filled 2014-02-11: qty 2

## 2014-02-11 MED ORDER — BUTAMBEN-TETRACAINE-BENZOCAINE 2-2-14 % EX AERO
INHALATION_SPRAY | CUTANEOUS | Status: DC | PRN
  Administered 2014-02-11: 2 via TOPICAL

## 2014-02-11 MED ORDER — SODIUM CHLORIDE 0.9 % IV SOLN
INTRAVENOUS | Status: DC
  Administered 2014-02-11: 500 mL via INTRAVENOUS

## 2014-02-11 MED ORDER — FENTANYL CITRATE 0.05 MG/ML IJ SOLN
INTRAMUSCULAR | Status: DC | PRN
  Administered 2014-02-11: 25 ug via INTRAVENOUS
  Administered 2014-02-11: 50 ug via INTRAVENOUS
  Administered 2014-02-11: 25 ug via INTRAVENOUS

## 2014-02-11 MED ORDER — FENTANYL CITRATE 0.05 MG/ML IJ SOLN
INTRAMUSCULAR | Status: AC
  Filled 2014-02-11: qty 2

## 2014-02-11 MED ORDER — MIDAZOLAM HCL 10 MG/2ML IJ SOLN
INTRAMUSCULAR | Status: DC | PRN
  Administered 2014-02-11: 2 mg via INTRAVENOUS
  Administered 2014-02-11: 1 mg via INTRAVENOUS
  Administered 2014-02-11 (×3): 2 mg via INTRAVENOUS
  Administered 2014-02-11: 1 mg via INTRAVENOUS

## 2014-02-11 MED ORDER — MIDAZOLAM HCL 5 MG/ML IJ SOLN
INTRAMUSCULAR | Status: AC
  Filled 2014-02-11: qty 1

## 2014-02-11 NOTE — Progress Notes (Signed)
  Echocardiogram Echocardiogram Transesophageal has been performed.  Margreta JourneyLOMBARDO, Charvi Gammage 02/11/2014, 11:23 AM

## 2014-02-11 NOTE — Discharge Instructions (Signed)
Transesophageal Echocardiogram °Transesophageal echocardiography (TEE) is a picture test of your heart using sound waves. The pictures taken can give very detailed pictures of your heart. This can help your doctor see if there are problems with your heart. TEE can check: °· If your heart has blood clots in it. °· How well your heart valves are working. °· If you have an infection on the inside of your heart. °· Some of the major arteries of your heart. °· If your heart valve is working after a repair. °· Your heart before a procedure that uses a shock to your heart to get the rhythm back to normal. °BEFORE THE PROCEDURE °· Do not eat or drink for 6 hours before the procedure or as told by your doctor. °· Make plans to have someone drive you home after the procedure. Do not drive yourself home. °· An IV tube will be put in your arm. °PROCEDURE °· You will be given a medicine to help you relax (sedative). It will be given through the IV tube. °· A numbing medicine will be sprayed or gargled in the back of your throat to help numb it. °· The tip of the probe is placed into the back of your mouth. You will be asked to swallow. This helps to pass the probe into your esophagus. °· Once the tip of the probe is in the right place, your doctor can take pictures of your heart. °· You may feel pressure at the back of your throat. °AFTER THE PROCEDURE °· You will be taken to a recovery area so the sedative can wear off. °· Your throat may be sore and scratchy. This will go away slowly over time. °· You will go home when you are fully awake and able to swallow liquids. °· You should have someone stay with you for the next 24 hours. °· Do not drive or operate machinery for the next 24 hours. °Document Released: 05/14/2009 Document Revised: 07/22/2013 Document Reviewed: 01/16/2013 °[employer]® Patient Information ©2015 [employer]. This information is not intended to replace advice given to you by your health care provider. Make  sure you discuss any questions you have with your health care provider. ° °

## 2014-02-11 NOTE — Interval H&P Note (Signed)
History and Physical Interval Note:  02/11/2014 9:31 AM  [name]  has presented today for surgery, with the diagnosis of STROKE  The various methods of treatment have been discussed with the patient and family. After consideration of risks, benefits and other options for treatment, the patient has consented to  Procedure(s): TRANSESOPHAGEAL ECHOCARDIOGRAM (TEE) (N/A) as a surgical intervention .  The patient's history has been reviewed, patient examined, no change in status, stable for surgery.  I have reviewed the patient's chart and labs.  Questions were answered to the patient's satisfaction.     Corene Resnick

## 2014-02-11 NOTE — H&P (View-Only) (Signed)
[facility] History and Physical  Patient: [name]  VQM:[id number]  DOB: August 02, 1980  DOS: the patient was seen and examined on 02/06/2014 PCP: Shirline Frees, MD  Chief Complaint: Visual disturbances  HPI: [name] is a 33 y.o. female with Past medical history of TIA, PFO. Patient presented with visual disturbances. She mentions that she has history of a TIA left-sided weakness 4 years ago. At which time she had an extensive workup done which was positive for PFO without any DVT and she was recommended to continue with aspirin. Since last one year she has not been taking her aspirin on a regular basis. 4 weeks ago she noticed that she had complaint of dizziness and left-sided numbness and she was seen in the ER but her symptoms resolved within 15 minutes and she did not have any residual symptoms and she was discharged home and was recommended to followup with her PCP.  She went to [place] for one week and came back, with her PCP they found that she had iron deficiency anemia and vitamin D deficiency and she was started on treatment for the same. Today while she was driving back from [place], she was not feeling herself. On arrival to home she started having difficulty controlling her vision and felt everything was swaying along with her eye movements. There was no weakness or numbness no speech difficulty. She attempted to lay down and sleep but despite that she continues to have similar symptoms 2 hours later and she decided to come to the hospital. At the time of my evaluation she mentions her symptoms have been gradually improving still not to her baseline. Pt denies any fever, chills, headache, cough, chest pain, palpitation, shortness of breath, orthopnea, PND, nausea, vomiting, abdominal pain, diarrhea, constipation, active bleeding, burning urination, dizziness, pedal edema,  focal neurological deficit.   The patient is coming from home. And at her  baseline independent for most of her ADL.  Review of Systems: as mentioned in the history of present illness.  A Comprehensive review of the other systems is negative.  Past Medical History  Diagnosis Date  . TIA (transient ischemic attack) 2011  . Anxiety   . TIA (transient ischemic attack)    Past Surgical History  Procedure Laterality Date  . Cesarean section  2003, 2012   Social History:  reports that she has never smoked. She has never used smokeless tobacco. She reports that she does not drink alcohol or use illicit drugs.  Allergies  Allergen Reactions  . Strawberry Shortness Of Breath and Swelling    Tongue swelling  . Watermelon [Citrullus Vulgaris] Shortness Of Breath and Swelling  . Citalopram Hydrobromide Other (See Comments)    Was ineffective for patient  . Erythromycin Nausea And Vomiting  . Penicillins Hives, Itching and Rash    No family history on file.  Prior to Admission medications   Medication Sig Start Date End Date Taking? Authorizing Provider  acetaminophen (TYLENOL) 500 MG tablet Take 1,000 mg by mouth every 6 (six) hours as needed for moderate pain.   Yes Historical Provider, MD  ALPRAZolam Duanne Moron) 0.5 MG tablet Take 0.5 mg by mouth 3 (three) times daily as needed for anxiety.    Yes Historical Provider, MD  Cholecalciferol (VITAMIN D3) 5000 UNITS TABS Take 5,000 Units by mouth every morning.    Yes Historical Provider, MD  ferrous sulfate 325 (65 FE) MG EC tablet Take 325 mg by mouth daily with breakfast.   Yes  Historical Provider, MD  Multiple Vitamin (MULTIVITAMIN WITH MINERALS) TABS tablet Take 1 tablet by mouth every morning.    Yes Historical Provider, MD    Physical Exam: Filed Vitals:   02/06/14 2200 02/06/14 2215 02/06/14 2230 02/06/14 2300  BP: 116/79 119/80 111/80 125/85  Pulse: 93 97 90 88  Temp:    98.9 F (37.2 C)  TempSrc:    Oral  Resp: _0 Height:    _1  (1.626 m)  Weight:    100.472 kg (221 lb 8 oz)  SpO2: 97%  99% 99% 98%    General: Alert, Awake and Oriented to Time, Place and Person. Appear in mild distress Eyes: PERRL ENT: Oral Mucosa clear moist. Neck: No JVD Cardiovascular: S1 and S2 Present, No Murmur, Peripheral Pulses Present Respiratory: Bilateral Air entry equal and Decreased, Clear to Auscultation,  No Crackles,No wheezes Abdomen: Bowel Sound Present, Soft and Non tender Skin: No Rash Extremities: No Pedal edema, No calf tenderness Neurologic: Grossly no focal neuro deficit. Mental status AAOx3, speech normal, attention normal, Cranial Nerves PERRL, EOM normal and present, facial sensation to light touch present: , Motor strength bilateral equal strength 5/5, Sensation present to light touch, reflexes present knee and biceps, babinski negative, Proprioception normal, Cerebellar test normal finger nose finger.  Labs on Admission:  CBC:  Recent Labs Lab 02/06/14 1952 02/06/14 2008  WBC 8.0  --   NEUTROABS 4.1  --   HGB 10.5* 12.9  HCT 34.0* 38.0  MCV 74.9*  --   PLT 346  --     CMP     Component Value Date/Time   NA 139 02/06/2014 2008   K 4.0 02/06/2014 2008   CL 106 02/06/2014 2008   CO2 22 02/06/2014 1952   GLUCOSE 109* 02/06/2014 2008   BUN 8 02/06/2014 2008   CREATININE 0.60 02/06/2014 2008   CALCIUM 9.0 02/06/2014 1952   PROT 7.6 02/06/2014 1952   ALBUMIN 3.7 02/06/2014 1952   AST 21 02/06/2014 1952   ALT 20 02/06/2014 1952   ALKPHOS 60 02/06/2014 1952   BILITOT <0.2* 02/06/2014 1952   GFRNONAA >90 02/06/2014 1952   GFRAA >90 02/06/2014 1952    No results found for this basename: LIPASE, AMYLASE,  in the last 168 hours No results found for this basename: AMMONIA,  in the last 168 hours  No results found for this basename: CKTOTAL, CKMB, CKMBINDEX, TROPONINI,  in the last 168 hours BNP (last 3 results) No results found for this basename: PROBNP,  in the last 8760 hours  Radiological Exams on Admission: Ct Head Wo Contrast  02/06/2014   CLINICAL DATA:  Code stroke.   EXAM: CT HEAD WITHOUT CONTRAST  TECHNIQUE: Contiguous axial images were obtained from the base of the skull through the vertex without intravenous contrast.  COMPARISON:  MRI 02/09/2010.  CT 02/07/2010.  FINDINGS: No mass. No hydrocephalus. No hemorrhage. No acute bony abnormality.  IMPRESSION: Negative exam. These results were called by telephone at the time of interpretation on 02/06/2014 at 8:22 PM to Dr. Nicole Kindred, who verbally acknowledged these results.   Electronically Signed   By: Marcello Moores  Register   On: 02/06/2014 20:24   Mr Jodene Nam Head Wo Contrast  02/06/2014   CLINICAL DATA:  New onset of visual difficulty, and lightheadedness. Diplopia.  EXAM: MRI HEAD WITHOUT CONTRAST  MRA HEAD WITHOUT CONTRAST  TECHNIQUE: Multiplanar, multiecho pulse sequences of the brain and surrounding structures were obtained without intravenous contrast. Angiographic images  of the head were obtained using MRA technique without contrast.  COMPARISON:  CT head earlier in the day.  MR head 01/30/2010.  FINDINGS: MRI HEAD FINDINGS  There is an acute right paramedian midbrain infarct involving the periaqueductal gray matter. This measures approximately 4 x 7 mm. No other acute infarcts are seen. Remote right greater than left subcentimeter infarcts in the cerebellar hemispheres suggest previous posterior circulation ischemia. No mass lesion, hydrocephalus, or extra-axial fluid.  Normal cerebral volume. No white matter disease. Heterogeneity of the bone marrow in the clivus and upper cervical region, with loss of the normal T1 hyperintense marrow, likely related to the patient's anemia. Smoking or chronic disease could have a similar appearance.  No acute sinus or mastoid disease. Right middle turbinate concha bullosa. Negative orbits.  Compared today's earlier CT, the infarct is not visible. Compared to prior MR, the infarct was not present.  MRA HEAD FINDINGS  The internal carotid arteries are widely patent. The basilar artery is widely  patent with vertebrals codominant. Bilateral fetal PCA origins. No proximal stenosis or vascular occlusion. No intracranial aneurysm.  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 No other acute infarcts are seen.  Evidence for chronic bilateral cerebellar infarcts.  Unremarkable MRA intracranial.   Electronically Signed   By: Rolla Flatten M.D.   On: 02/06/2014 21:39   Mr Brain Wo Contrast  02/06/2014   CLINICAL DATA:  New onset of visual difficulty, and lightheadedness. Diplopia.  EXAM: MRI HEAD WITHOUT CONTRAST  MRA HEAD WITHOUT CONTRAST  TECHNIQUE: Multiplanar, multiecho pulse sequences of the brain and surrounding structures were obtained without intravenous contrast. Angiographic images of the head were obtained using MRA technique without contrast.  COMPARISON:  CT head earlier in the day.  MR head 01/30/2010.  FINDINGS: MRI HEAD FINDINGS  There is an acute right paramedian midbrain infarct involving the periaqueductal gray matter. This measures approximately 4 x 7 mm. No other acute infarcts are seen. Remote right greater than left subcentimeter infarcts in the cerebellar hemispheres suggest previous posterior circulation ischemia. No mass lesion, hydrocephalus, or extra-axial fluid.  Normal cerebral volume. No white matter disease. Heterogeneity of the bone marrow in the clivus and upper cervical region, with loss of the normal T1 hyperintense marrow, likely related to the patient's anemia. Smoking or chronic disease could have a similar appearance.  No acute sinus or mastoid disease. Right middle turbinate concha bullosa. Negative orbits.  Compared today's earlier CT, the infarct is not visible. Compared to prior MR, the infarct was not present.  MRA HEAD FINDINGS  The internal carotid arteries are widely patent. The basilar artery is widely patent with vertebrals codominant. Bilateral fetal PCA origins. No proximal stenosis or vascular occlusion. No  intracranial aneurysm.  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 No other acute infarcts are seen.  Evidence for chronic bilateral cerebellar infarcts.  Unremarkable MRA intracranial.   Electronically Signed   By: Rolla Flatten M.D.   On: 02/06/2014 21:39    EKG: Independently reviewed. normal sinus rhythm, nonspecific ST and T waves changes.  Assessment/Plan Principal Problem:   Acute CVA (cerebrovascular accident) Active Problems:   Microcytic anemia   1. Acute CVA (cerebrovascular accident) Patient is presenting with complaints of blurred vision. MRI of the brain is positive for right midbrain infarct. It is also positive for chronic bilateral cerebellar infarct. Patient denies any family history of blood disorder or recurrent CVA or vasculitis. It is unclear  whether she had vasculitis workup done during her prior TIA. Currently she is admitted to the hospital we will monitor her on telemetry, given recheck echocardiogram with bubble study for PFO, carotid Doppler, PTOT and speech consultation, aspirin 81 mg daily as per neurology, check hemoglobin A1c, Factor V,ESR, CRP, ANA. She may require further workup which may need to be done as an outpatient.  2.Microcytic anemia Continue iron supplements  3.Vitamin D deficiency Continue vitamin D supplements.  Consults: Neurology  DVT Prophylaxis: subcutaneous Heparin Nutrition: Cardiac diet  Code Status: Full  Family Communication: Significant other was present at bedside, opportunity was given to ask question and all questions were answered satisfactorily at the time of interview. Disposition: Admitted to inpatient in telemetry unit.  Author: Berle Mull, MD [facility] Pager: [phone] 02/06/2014, 11:45 PM    If 7PM-7AM, please contact night-coverage [email or web address] Password [email or web address]  **Disclaimer: This note may have been dictated with voice recognition software. Similar  sounding words can inadvertently be transcribed and this note may contain transcription errors which may not have been corrected upon publication of note.**

## 2014-02-11 NOTE — Op Note (Signed)
INDICATIONS: Stroke  PROCEDURE:   Informed consent was obtained prior to the procedure. The risks, benefits and alternatives for the procedure were discussed and the patient comprehended these risks.  Risks include, but are not limited to, cough, sore throat, vomiting, nausea, somnolence, esophageal and stomach trauma or perforation, bleeding, low blood pressure, aspiration, pneumonia, infection, trauma to the teeth and death.    After a procedural time-out, the oropharynx was anesthetized with 20% benzocaine spray. The patient was given 10 mg versed and 100 mcg fentanyl for moderate sedation.   The transesophageal probe was inserted in the esophagus and stomach without difficulty and multiple views were obtained.  The patient was kept under observation until the patient left the procedure room.  The patient left the procedure room in stable condition.   Agitated microbubble saline contrast was administered.  COMPLICATIONS:    There were no immediate complications.  FINDINGS:  Atrial septal aneurysm Medium size ASD, secundum type. Bidirectional shunt, predominantly left to right, but with spontaneous beat to beat right to left shunt at rest, during quiet breathing. Otherwise normal study.  RECOMMENDATIONS:   Strongly consider ASD device closure.  Time Spent Directly with the Patient:  60 minutes   Renardo Cheatum 02/11/2014, 10:29 AM

## 2014-02-12 ENCOUNTER — Ambulatory Visit (INDEPENDENT_AMBULATORY_CARE_PROVIDER_SITE_OTHER): Payer: 59 | Admitting: Cardiovascular Disease

## 2014-02-12 ENCOUNTER — Encounter (HOSPITAL_COMMUNITY): Payer: Self-pay | Admitting: Cardiovascular Disease

## 2014-02-12 VITALS — BP 140/95 | HR 83 | Ht 64.0 in | Wt 221.8 lb

## 2014-02-12 DIAGNOSIS — Q211 Atrial septal defect, unspecified: Secondary | ICD-10-CM

## 2014-02-12 DIAGNOSIS — Q2111 Secundum atrial septal defect: Secondary | ICD-10-CM

## 2014-02-12 LAB — FACTOR 5 LEIDEN

## 2014-02-12 LAB — PROTHROMBIN GENE MUTATION

## 2014-02-12 MED ORDER — CLOPIDOGREL BISULFATE 75 MG PO TABS: 75.0000 mg | ORAL_TABLET | Freq: Every day | ORAL | Status: DC

## 2014-02-12 NOTE — Patient Instructions (Signed)
Your physician has recommended you make the following change in your medication: START Plavix 75mg  take one by mouth daily  Dr Excell Seltzerooper has recommended ASD closure.  Please follow pre-procedure instructions.

## 2014-02-13 ENCOUNTER — Encounter: Payer: Self-pay | Admitting: Cardiovascular Disease

## 2014-02-13 ENCOUNTER — Encounter (HOSPITAL_COMMUNITY): Payer: Self-pay | Admitting: Pharmacy Technician

## 2014-02-13 NOTE — Progress Notes (Signed)
HPI:  33 year old woman referred for evaluation of of atrial septal defect after recent stroke. The patient initially presented with a TIA about 4 years ago when she experienced left-sided facial droop and arm weakness. Symptoms resolved but she was noted to have a positive transcranial Doppler at that time. Medical therapy with aspirin was recommended. Her clinical course was uneventful until about 5 weeks ago when she developed a heaviness in her head. She was evaluated in the emergency department in because of an absence of focal neurologic symptoms, no imaging was performed. She then had sudden onset of near vision loss about one week ago. There was no arm or leg weakness. There was no speech difficulty. Her symptoms persisted and she was evaluated again in the hospital. MRI of the brain demonstrated an acute midbrain infarct and evidence for chronic bilateral cerebellar infarcts. Serologic evaluation was unremarkable. There was no evidence of extracranial arterial disease. 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 She presents today for further evaluation. The patient has no other significant past medical history. She has been previously healthy. She's had no surgeries other than 2 C-sections. There is no history of stroke or premature CAD in the family  Outpatient Encounter Prescriptions as of 02/12/2014  Medication Sig  . acetaminophen (TYLENOL) 500 MG tablet Take 1,000 mg by mouth every 6 (six) hours as needed for moderate pain.  Marland Kitchen. ALPRAZolam (XANAX) 0.5 MG tablet Take 0.5 mg by mouth 3 (three) times daily as needed for anxiety.   Marland Kitchen. aspirin EC 81 MG EC tablet Take 1 tablet (81 mg total) by mouth daily.  . Cholecalciferol (VITAMIN D3) 5000 UNITS TABS Take 5,000 Units by mouth every morning.   . ferrous sulfate 325 (65 FE) MG EC tablet Take 325 mg by mouth daily with  breakfast.  . Multiple Vitamin (MULTIVITAMIN WITH MINERALS) TABS tablet Take 1 tablet by mouth every morning.   . clopidogrel (PLAVIX) 75 MG tablet Take 1 tablet (75 mg total) by mouth daily.    Strawberry; Watermelon; Citalopram hydrobromide; Erythromycin; and Penicillins  Past Medical History  Diagnosis Date  . TIA (transient ischemic attack) 2011  . Anxiety   . TIA (transient ischemic attack)     Past Surgical History  Procedure Laterality Date  . Cesarean section  2003, 2012  . Tee without cardioversion N/A 02/11/2014    Procedure: TRANSESOPHAGEAL ECHOCARDIOGRAM (TEE);  Surgeon: Thurmon FairMihai Croitoru, MD;  [facility];  Service: Cardiovascular;  Laterality: N/A;    History   Social History  . Marital Status: Married    Spouse Name: N/A    Number of Children: N/A  . Years of Education: N/A   Occupational History  . Not on file.   Social History Main Topics  . Smoking status: Never Smoker   . Smokeless tobacco: Never Used  . Alcohol Use: No  . Drug Use: No  . Sexual Activity: Yes   Other Topics Concern  . Not on file   Social History Narrative  . No narrative on file    Family history: Negative for DVT, pulmonary embolus, stroke, or premature CAD  ROS:  General: no fevers/chills/night sweats Eyes: Positive for recent episode of blurry vision, slowly improving ENT: no sore throat or hearing loss Resp: no cough, wheezing, or hemoptysis CV: no edema or palpitations GI: no abdominal pain, nausea, vomiting, diarrhea, or  constipation GU: no dysuria, frequency, or hematuria Skin: no rash Neuro: Positive for headache and dizziness Musculoskeletal: no joint pain or swelling Heme: no bleeding, DVT, or easy bruising Endo: no polydipsia or polyuria  BP 140/95  Pulse 83  Ht 5\' 4"  (1.626 m)  Wt 221 lb 12.8 oz (100.608 kg)  BMI 38.05 kg/m2  LMP 01/22/2014  PHYSICAL EXAM: Pt is alert and oriented, WD, WN, in no distress. HEENT: normal Neck: JVP normal.  Carotid upstrokes normal without bruits. No thyromegaly. Lungs: equal expansion, clear bilaterally CV: Apex is discrete and nondisplaced, RRR without murmur or gallop Abd: soft, NT, +BS, no bruit, no hepatosplenomegaly Back: no CVA tenderness Ext: no C/C/E        Femoral pulses 2+= without bruits        DP/PT pulses intact and = Skin: warm and dry without rash Neuro: CNII-XII intact             Strength intact = bilaterally  EKG:  Reviewed from 02/07/2014: Sinus tachycardia 107 beats per minute, otherwise within normal limits.  2-D echocardiogram: Study Conclusions  - Left ventricle: The cavity size was normal. Wall thickness was normal. Systolic function was normal. The estimated ejection fraction was in the range of 60% to 65%. Wall motion was normal; there were no regional wall motion abnormalities. Doppler parameters are consistent with abnormal left ventricular relaxation (grade 1 diastolic dysfunction). - Mitral valve: There was trivial regurgitation. - Left atrium: The atrium was mildly dilated. - Atrial septum: Poorly visualized. No defect or patent foramen ovale was identified. There was an atrial septal aneurysm.  TEE: Left ventricle: Systolic function was normal. Wall motion was normal; there were no regional wall motion abnormalities.  ------------------------------------------------------------------- Aortic valve: Structurally normal valve. Trileaflet; normal thickness leaflets. Cusp separation was normal. Doppler: There was no significant regurgitation.  ------------------------------------------------------------------- Aorta: There was no atheroma. There was no evidence for dissection. Aortic root: The aortic root was not dilated. Ascending aorta: The ascending aorta was normal in size. Aortic arch: The aortic arch was normal in size. Descending aorta: The descending aorta was normal in  size.  ------------------------------------------------------------------- Mitral valve: Structurally normal valve. Leaflet separation was normal. Doppler: There was no significant regurgitation.  ------------------------------------------------------------------- Left atrium: The atrium was normal in size. No evidence of thrombus in the atrial cavity or appendage. The appendage was morphologically a left appendage, multilobulated, and of normal size. Emptying velocity was normal.  ------------------------------------------------------------------- Atrial septum: There was a medium-sized secundum atrial septal defect. The ASD diameter was between 5 mm and 7 mm depending on respiration related septal motion. Doppler and agitated saline contrast study showed a small bidirectional, but predominantly left-to-right, atrial level shunt, in the baseline state. There was an atrial septal aneurysm. There is prompt beat-by-beat right to left shunt, which augments during quiet inspiration.  ------------------------------------------------------------------- Right ventricle: The cavity size was normal. Wall thickness was normal. Systolic function was normal.  ------------------------------------------------------------------- Pulmonic valve: Structurally normal valve.  ------------------------------------------------------------------- Tricuspid valve: Structurally normal valve. Leaflet separation was normal. Doppler: There was no significant regurgitation.  ------------------------------------------------------------------- Pulmonary artery: The main pulmonary artery was normal-sized.  ------------------------------------------------------------------- Right atrium: The atrium was normal in size. No evidence of thrombus in the atrial cavity or appendage. The appendage was morphologically a right appendage.  ------------------------------------------------------------------- Pericardium:  There was no pericardial effusion.  ASSESSMENT AND PLAN: This is a 33 year old woman with a fossa type ostium secundum ASD. This is associated with an interatrial septal aneurysm. The patient has had multiple  strokes with no other identifiable risk factors for such. I think this is clearly an indication for transcatheter ASD closure. I have personally reviewed her TEE images. I have discussed treatment options in detail with the patient and her husband. They would strongly like to proceed with closure. We contrasted options of ongoing medical therapy with antiplatelet drugs versus transcatheter closure. I have reviewed the specific risks of transcatheter closure including bleeding, vascular injury, device embolization, stroke, myocardial infarction, emergency surgery, and arrhythmia. Also discussed the potential for late complication related to device erosion. She understands that this occurs at very low frequency. All questions were answered. The patient would like to proceed. She will start Plavix 75 mg daily and continue aspirin 81 mg daily. She understands the need for SBE prophylaxis for 6 months following device closure. Her husband was present today for the entire exam and discussion. She will be scheduled for the next available time.  Tonny Bollman 02/13/2014 1:45 PM

## 2014-02-16 ENCOUNTER — Encounter (HOSPITAL_COMMUNITY): Payer: Self-pay | Admitting: General Practice

## 2014-02-16 ENCOUNTER — Other Ambulatory Visit: Payer: Self-pay | Admitting: Cardiovascular Disease

## 2014-02-16 ENCOUNTER — Encounter (HOSPITAL_COMMUNITY)
Admission: RE | Disposition: A | Discharge status: Home / Self Care | Payer: Self-pay | Source: Ambulatory Visit | Attending: Cardiovascular Disease

## 2014-02-16 ENCOUNTER — Ambulatory Visit (HOSPITAL_COMMUNITY)
Admission: RE | Admit: 2014-02-16 | Discharge: 2014-02-17 | Disposition: A | Discharge status: Home / Self Care | Payer: 59 | Source: Ambulatory Visit | Attending: Cardiovascular Disease | Admitting: Cardiovascular Disease

## 2014-02-16 DIAGNOSIS — Q2111 Secundum atrial septal defect: Secondary | ICD-10-CM

## 2014-02-16 DIAGNOSIS — Y849 Medical procedure, unspecified as the cause of abnormal reaction of the patient, or of later complication, without mention of misadventure at the time of the procedure: Secondary | ICD-10-CM | POA: Insufficient documentation

## 2014-02-16 DIAGNOSIS — Q211 Atrial septal defect, unspecified: Secondary | ICD-10-CM

## 2014-02-16 DIAGNOSIS — Z7902 Long term (current) use of antithrombotics/antiplatelets: Secondary | ICD-10-CM | POA: Diagnosis not present

## 2014-02-16 DIAGNOSIS — Z880 Allergy status to penicillin: Secondary | ICD-10-CM | POA: Diagnosis not present

## 2014-02-16 DIAGNOSIS — Z8673 Personal history of transient ischemic attack (TIA), and cerebral infarction without residual deficits: Secondary | ICD-10-CM | POA: Diagnosis not present

## 2014-02-16 DIAGNOSIS — S301XXA Contusion of abdominal wall, initial encounter: Secondary | ICD-10-CM

## 2014-02-16 DIAGNOSIS — IMO0002 Reserved for concepts with insufficient information to code with codable children: Secondary | ICD-10-CM | POA: Diagnosis not present

## 2014-02-16 DIAGNOSIS — Z881 Allergy status to other antibiotic agents status: Secondary | ICD-10-CM | POA: Insufficient documentation

## 2014-02-16 DIAGNOSIS — Z888 Allergy status to other drugs, medicaments and biological substances status: Secondary | ICD-10-CM | POA: Diagnosis not present

## 2014-02-16 DIAGNOSIS — I639 Cerebral infarction, unspecified: Secondary | ICD-10-CM | POA: Diagnosis present

## 2014-02-16 DIAGNOSIS — Z7982 Long term (current) use of aspirin: Secondary | ICD-10-CM | POA: Diagnosis not present

## 2014-02-16 DIAGNOSIS — F411 Generalized anxiety disorder: Secondary | ICD-10-CM | POA: Insufficient documentation

## 2014-02-16 DIAGNOSIS — D509 Iron deficiency anemia, unspecified: Secondary | ICD-10-CM | POA: Diagnosis not present

## 2014-02-16 DIAGNOSIS — I253 Aneurysm of heart: Secondary | ICD-10-CM | POA: Insufficient documentation

## 2014-02-16 HISTORY — DX: Cerebral infarction, unspecified: I63.9

## 2014-02-16 HISTORY — PX: ASD REPAIR: SHX258

## 2014-02-16 LAB — POCT ACTIVATED CLOTTING TIME
ACTIVATED CLOTTING TIME: 174 s
ACTIVATED CLOTTING TIME: 163 s
Activated Clotting Time: 214 seconds
Activated Clotting Time: 196 seconds
Activated Clotting Time: 225 seconds
Activated Clotting Time: 180 seconds

## 2014-02-16 LAB — PREGNANCY, URINE: Preg Test, Ur: NEGATIVE

## 2014-02-16 SURGERY — REPAIR, ATRIAL SEPTAL DEFECT
Anesthesia: LOCAL

## 2014-02-16 MED ORDER — SODIUM CHLORIDE 0.9 % IJ SOLN: 3.0000 mL | INTRAMUSCULAR | Status: DC | PRN

## 2014-02-16 MED ORDER — ASPIRIN 81 MG PO CHEW
81.0000 mg | CHEWABLE_TABLET | Freq: Every day | ORAL | Status: DC
  Administered 2014-02-17: 10:00:00 81 mg via ORAL
  Filled 2014-02-16: qty 1

## 2014-02-16 MED ORDER — ONDANSETRON HCL 4 MG/2ML IJ SOLN: 4.0000 mg | Freq: Four times a day (QID) | INTRAMUSCULAR | Status: DC | PRN

## 2014-02-16 MED ORDER — SODIUM CHLORIDE 0.9 % IV SOLN: 250.0000 mL | INTRAVENOUS | Status: DC | PRN

## 2014-02-16 MED ORDER — SODIUM CHLORIDE 0.9 % IJ SOLN: 3.0000 mL | Freq: Two times a day (BID) | INTRAMUSCULAR | Status: DC

## 2014-02-16 MED ORDER — ALPRAZOLAM 0.25 MG PO TABS
ORAL_TABLET | ORAL | Status: AC
  Filled 2014-02-16: qty 2

## 2014-02-16 MED ORDER — MIDAZOLAM HCL 2 MG/2ML IJ SOLN
INTRAMUSCULAR | Status: AC
  Filled 2014-02-16: qty 2

## 2014-02-16 MED ORDER — DIAZEPAM 2 MG PO TABS: 5.0000 mg | ORAL_TABLET | Freq: Once | ORAL | Status: DC

## 2014-02-16 MED ORDER — MORPHINE SULFATE 2 MG/ML IJ SOLN
2.0000 mg | INTRAMUSCULAR | Status: DC | PRN
Start: 2014-02-16 — End: 2014-02-17

## 2014-02-16 MED ORDER — CLOPIDOGREL BISULFATE 75 MG PO TABS
75.0000 mg | ORAL_TABLET | Freq: Every day | ORAL | Status: DC
  Administered 2014-02-17: 75 mg via ORAL

## 2014-02-16 MED ORDER — HEPARIN (PORCINE) IN NACL 2-0.9 UNIT/ML-% IJ SOLN
INTRAMUSCULAR | Status: AC
Start: 2014-02-16 — End: 2014-02-16
  Filled 2014-02-16: qty 1000

## 2014-02-16 MED ORDER — ALPRAZOLAM 0.5 MG PO TABS
0.5000 mg | ORAL_TABLET | Freq: Once | ORAL | Status: AC
  Administered 2014-02-16: 0.5 mg via ORAL

## 2014-02-16 MED ORDER — SODIUM CHLORIDE 0.9 % IV SOLN
500.0000 mL | Freq: Once | INTRAVENOUS | Status: AC
  Administered 2014-02-16: 20:00:00 1000 mL via INTRAVENOUS

## 2014-02-16 MED ORDER — DIAZEPAM 5 MG PO TABS
5.0000 mg | ORAL_TABLET | Freq: Once | ORAL | Status: AC
  Administered 2014-02-16: 5 mg via ORAL

## 2014-02-16 MED ORDER — DIAZEPAM 5 MG PO TABS
ORAL_TABLET | ORAL | Status: AC
  Filled 2014-02-16: qty 1

## 2014-02-16 MED ORDER — ALUM & MAG HYDROXIDE-SIMETH 200-200-20 MG/5ML PO SUSP
30.0000 mL | ORAL | Status: DC | PRN
  Administered 2014-02-16 – 2014-02-17 (×3): 30 mL via ORAL
  Filled 2014-02-16 (×3): qty 30

## 2014-02-16 MED ORDER — LIDOCAINE HCL (PF) 1 % IJ SOLN
INTRAMUSCULAR | Status: AC
  Filled 2014-02-16: qty 30

## 2014-02-16 MED ORDER — SODIUM CHLORIDE 0.9 % IV SOLN
INTRAVENOUS | Status: DC
  Administered 2014-02-16: 08:00:00 via INTRAVENOUS

## 2014-02-16 MED ORDER — SODIUM CHLORIDE 0.9 % IV SOLN: 1.0000 mL/kg/h | INTRAVENOUS | Status: AC

## 2014-02-16 MED ORDER — OXYCODONE-ACETAMINOPHEN 5-325 MG PO TABS
1.0000 | ORAL_TABLET | ORAL | Status: DC | PRN
  Administered 2014-02-16 (×2): 2 via ORAL
  Filled 2014-02-16 (×2): qty 2

## 2014-02-16 MED ORDER — HEPARIN SODIUM (PORCINE) 1000 UNIT/ML IJ SOLN
INTRAMUSCULAR | Status: AC
  Filled 2014-02-16: qty 1

## 2014-02-16 MED ORDER — FENTANYL CITRATE 0.05 MG/ML IJ SOLN
INTRAMUSCULAR | Status: AC
  Filled 2014-02-16: qty 2

## 2014-02-16 MED ORDER — VANCOMYCIN HCL 10 G IV SOLR
1250.0000 mg | Freq: Once | INTRAVENOUS | Status: AC
  Administered 2014-02-16: 1250 mg via INTRAVENOUS
  Filled 2014-02-16: qty 1250

## 2014-02-16 MED ORDER — ACETAMINOPHEN 325 MG PO TABS
650.0000 mg | ORAL_TABLET | ORAL | Status: DC | PRN
  Administered 2014-02-16: 650 mg via ORAL
  Filled 2014-02-16: qty 2

## 2014-02-16 NOTE — H&P (View-Only) (Signed)
 HPI:  33-year-old woman referred for evaluation of of atrial septal defect after recent stroke. The patient initially presented with a TIA about 4 years ago when she experienced left-sided facial droop and arm weakness. Symptoms resolved but she was noted to have a positive transcranial Doppler at that time. Medical therapy with aspirin was recommended. Her clinical course was uneventful until about 5 weeks ago when she developed a heaviness in her head. She was evaluated in the emergency department in because of an absence of focal neurologic symptoms, no imaging was performed. She then had sudden onset of near vision loss about one week ago. There was no arm or leg weakness. There was no speech difficulty. Her symptoms persisted and she was evaluated again in the hospital. MRI of the brain demonstrated an acute midbrain infarct and evidence for chronic bilateral cerebellar infarcts. Serologic evaluation was unremarkable. There was no evidence of extracranial arterial disease. 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 She presents today for further evaluation. The patient has no other significant past medical history. She has been previously healthy. She's had no surgeries other than 2 C-sections. There is no history of stroke or premature CAD in the family  Outpatient Encounter Prescriptions as of 02/12/2014  Medication Sig  . acetaminophen (TYLENOL) 500 MG tablet Take 1,000 mg by mouth every 6 (six) hours as needed for moderate pain.  . ALPRAZolam (XANAX) 0.5 MG tablet Take 0.5 mg by mouth 3 (three) times daily as needed for anxiety.   . aspirin EC 81 MG EC tablet Take 1 tablet (81 mg total) by mouth daily.  . Cholecalciferol (VITAMIN D3) 5000 UNITS TABS Take 5,000 Units by mouth every morning.   . ferrous sulfate 325 (65 FE) MG EC tablet Take 325 mg by mouth daily with  breakfast.  . Multiple Vitamin (MULTIVITAMIN WITH MINERALS) TABS tablet Take 1 tablet by mouth every morning.   . clopidogrel (PLAVIX) 75 MG tablet Take 1 tablet (75 mg total) by mouth daily.    Strawberry; Watermelon; Citalopram hydrobromide; Erythromycin; and Penicillins  Past Medical History  Diagnosis Date  . TIA (transient ischemic attack) 2011  . Anxiety   . TIA (transient ischemic attack)     Past Surgical History  Procedure Laterality Date  . Cesarean section  2003, 2012  . Tee without cardioversion N/A 02/11/2014    Procedure: TRANSESOPHAGEAL ECHOCARDIOGRAM (TEE);  Surgeon: Mihai Croitoru, MD;  Location: [facility];  Service: Cardiovascular;  Laterality: N/A;    History   Social History  . Marital Status: Married    Spouse Name: N/A    Number of Children: N/A  . Years of Education: N/A   Occupational History  . Not on file.   Social History Main Topics  . Smoking status: Never Smoker   . Smokeless tobacco: Never Used  . Alcohol Use: No  . Drug Use: No  . Sexual Activity: Yes   Other Topics Concern  . Not on file   Social History Narrative  . No narrative on file    Family history: Negative for DVT, pulmonary embolus, stroke, or premature CAD  ROS:  General: no fevers/chills/night sweats Eyes: Positive for recent episode of blurry vision, slowly improving ENT: no sore throat or hearing loss Resp: no cough, wheezing, or hemoptysis CV: no edema or palpitations GI: no abdominal pain, nausea, vomiting, diarrhea, or   constipation GU: no dysuria, frequency, or hematuria Skin: no rash Neuro: Positive for headache and dizziness Musculoskeletal: no joint pain or swelling Heme: no bleeding, DVT, or easy bruising Endo: no polydipsia or polyuria  BP 140/95  Pulse 83  Ht 5' 4" (1.626 m)  Wt 221 lb 12.8 oz (100.608 kg)  BMI 38.05 kg/m2  LMP 01/22/2014  PHYSICAL EXAM: Pt is alert and oriented, WD, WN, in no distress. HEENT: normal Neck: JVP normal.  Carotid upstrokes normal without bruits. No thyromegaly. Lungs: equal expansion, clear bilaterally CV: Apex is discrete and nondisplaced, RRR without murmur or gallop Abd: soft, NT, +BS, no bruit, no hepatosplenomegaly Back: no CVA tenderness Ext: no C/C/E        Femoral pulses 2+= without bruits        DP/PT pulses intact and = Skin: warm and dry without rash Neuro: CNII-XII intact             Strength intact = bilaterally  EKG:  Reviewed from 02/07/2014: Sinus tachycardia 107 beats per minute, otherwise within normal limits.  2-D echocardiogram: Study Conclusions  - Left ventricle: The cavity size was normal. Wall thickness was normal. Systolic function was normal. The estimated ejection fraction was in the range of 60% to 65%. Wall motion was normal; there were no regional wall motion abnormalities. Doppler parameters are consistent with abnormal left ventricular relaxation (grade 1 diastolic dysfunction). - Mitral valve: There was trivial regurgitation. - Left atrium: The atrium was mildly dilated. - Atrial septum: Poorly visualized. No defect or patent foramen ovale was identified. There was an atrial septal aneurysm.  TEE: Left ventricle: Systolic function was normal. Wall motion was normal; there were no regional wall motion abnormalities.  ------------------------------------------------------------------- Aortic valve: Structurally normal valve. Trileaflet; normal thickness leaflets. Cusp separation was normal. Doppler: There was no significant regurgitation.  ------------------------------------------------------------------- Aorta: There was no atheroma. There was no evidence for dissection. Aortic root: The aortic root was not dilated. Ascending aorta: The ascending aorta was normal in size. Aortic arch: The aortic arch was normal in size. Descending aorta: The descending aorta was normal in  size.  ------------------------------------------------------------------- Mitral valve: Structurally normal valve. Leaflet separation was normal. Doppler: There was no significant regurgitation.  ------------------------------------------------------------------- Left atrium: The atrium was normal in size. No evidence of thrombus in the atrial cavity or appendage. The appendage was morphologically a left appendage, multilobulated, and of normal size. Emptying velocity was normal.  ------------------------------------------------------------------- Atrial septum: There was a medium-sized secundum atrial septal defect. The ASD diameter was between 5 mm and 7 mm depending on respiration related septal motion. Doppler and agitated saline contrast study showed a small bidirectional, but predominantly left-to-right, atrial level shunt, in the baseline state. There was an atrial septal aneurysm. There is prompt beat-by-beat right to left shunt, which augments during quiet inspiration.  ------------------------------------------------------------------- Right ventricle: The cavity size was normal. Wall thickness was normal. Systolic function was normal.  ------------------------------------------------------------------- Pulmonic valve: Structurally normal valve.  ------------------------------------------------------------------- Tricuspid valve: Structurally normal valve. Leaflet separation was normal. Doppler: There was no significant regurgitation.  ------------------------------------------------------------------- Pulmonary artery: The main pulmonary artery was normal-sized.  ------------------------------------------------------------------- Right atrium: The atrium was normal in size. No evidence of thrombus in the atrial cavity or appendage. The appendage was morphologically a right appendage.  ------------------------------------------------------------------- Pericardium:  There was no pericardial effusion.  ASSESSMENT AND PLAN: This is a 33-year-old woman with a fossa type ostium secundum ASD. This is associated with an interatrial septal aneurysm. The patient has had multiple   strokes with no other identifiable risk factors for such. I think this is clearly an indication for transcatheter ASD closure. I have personally reviewed her TEE images. I have discussed treatment options in detail with the patient and her husband. They would strongly like to proceed with closure. We contrasted options of ongoing medical therapy with antiplatelet drugs versus transcatheter closure. I have reviewed the specific risks of transcatheter closure including bleeding, vascular injury, device embolization, stroke, myocardial infarction, emergency surgery, and arrhythmia. Also discussed the potential for late complication related to device erosion. She understands that this occurs at very low frequency. All questions were answered. The patient would like to proceed. She will start Plavix 75 mg daily and continue aspirin 81 mg daily. She understands the need for SBE prophylaxis for 6 months following device closure. Her husband was present today for the entire exam and discussion. She will be scheduled for the next available time.  [name] 02/13/2014 1:45 PM    

## 2014-02-16 NOTE — Progress Notes (Signed)
Utilization Review Completed.[name] T7/20/2015  

## 2014-02-16 NOTE — Progress Notes (Signed)
ACT drawn and is 163 sec

## 2014-02-16 NOTE — Interval H&P Note (Signed)
History and Physical Interval Note:  02/16/2014 9:32 AM  [name]  has presented today for surgery, with the diagnosis of ASD defect  The various methods of treatment have been discussed with the patient and family. After consideration of risks, benefits and other options for treatment, the patient has consented to  Procedure(s): ATRIAL SEPTAL DEFECT (ASD) REPAIR (N/A) as a surgical intervention .  The patient's history has been reviewed, patient examined, no change in status, stable for surgery.  I have reviewed the patient's chart and labs.  Questions were answered to the patient's satisfaction.     Tonny BollmanMichael Amanda Pote

## 2014-02-16 NOTE — Progress Notes (Signed)
Pt remained in bed extra 2 hours due to hematoma. Remains level 2 hematoma, softer than previous per day RN, Percocet given for c/o back pain and rt groin pain.  Back discomfort improved significantly just by sitting up on side of bed.  Up to bathroom with assistance, voided.  Sat in chair approx 10 min when RN was called to room by husband.  Pt c/o feeling hot and sweaty, skin pale, distant look in eyes, voicing urgent need to move bowels.  Pt states sense of hearing and vision diminished and feeling very scared.  BP 64/32.  Lifted back to bed by staff and husband.  200 cc IV NS bolus given.  BP up to 109/77 within 10 minutes, pt reported feeling better.  Dr. Shirlee LatchMcLean notified of episode and interventions, no orders received except to keep in bed.  Pt anxious and tearful, asking questions and worrying about her heart.  Explained about vagal response, reassurance provided.  Husband at bedside, very supportive and calm.  Frequent checks finds pt feeling fine.  Denies back pain now, groin tender only on palpation, remains level 2, unchanged from first shift assessment.  Call light in reach, reminded pt and husband to call for any needs or change in condition.

## 2014-02-16 NOTE — Progress Notes (Signed)
Patient teary eyed. Reassurance given

## 2014-02-16 NOTE — Progress Notes (Signed)
Dr Excell Seltzerooper was called for an order for Xanax 0.5 mg x 701for anxiety about sheath pull. Dr Excell Seltzerooper returned the page and orders followed.

## 2014-02-16 NOTE — Progress Notes (Signed)
Site area: right groin  Site Prior to Removal:  Level 2 Pressure Applied For  25 MINUTES   Manual:   Yes/ two venous sheaths removed one at a time by Dana[employer] CorporationSherrell RTAC from right femoral vein  Patient Status During Pull:  Nervous / c/o of chest pressure 3/10 but states she is very nervous,  Post Pull Groin Site:  Level 2, area was marked after procedure 12 x 9 cm  Post Pull Instructions Given:  Yes  Post Pull Pulses Present: yes right dp 3+ Dressing Applied:  tegaderm  Bedrest begins @1325  Comments prior to sheath pull Dr Excell Seltzerooper was called for something else for anxiety, orders followed.

## 2014-02-16 NOTE — CV Procedure (Signed)
   Cardiac Catheterization Procedure Note  Name: [name] T Cogle MRN: [id number] DOB: Apr 22, 1981  Procedure: Intracardiac echocardiography, transcatheter ASD closure  Indication: Fossa type ostium secundum ASD with recurrent stroke  Device: 30 mm Cribriform ASO   Procedural details: The right groin was prepped, draped, and anesthetized with 1% lidocaine. Using modified Seldinger technique, a 9 French sheath was placed in the right femoral vein and an 8 French sheath was placed in the right femoral vein. An intracardiac echo probe was advanced under fluoroscopic guidance into the right atrium and standard imaging was performed. An angled glide wire and multipurpose catheter were used to cross the interatrial septum without difficulty. Initially, the multipurpose catheter was advanced into the left lower pulmonary vein and changed out for an Amplatzer wire. Imaging was performed in this demonstrated an interatrial septal aneurysm with ASD measurement approximately 9-10 mm with a wire across. A 25 mm cribriform device was chosen. The device was prepped using normal technique. An 8 Fr Torquevue delivery sheath was advanced across the interatrial septum. The device was deployed but not released from the table. However, after careful imaging, the discs did not display across the secundum septum in an appropriate fashion. We were concerned about risk of device embolization. The device was retrieved and removed from the body. A 24 mm sizing balloon was then advanced and inflated until flow was stopped across the interatrial septum. Careful imaging and measurement was performed. The balloon was measured fluoroscopically and under ultrasound. The defect measured 12 mm. We felt a 30 mm cribriform device would close the defect fully. The device was again prepped on the table and a 9 French Torquevue catheter was advanced across the interatrial septum. A 30 mm device was deployed without difficulty. Extensive imaging  was again performed and the device was positioned appropriately. The device was released and proper position was again confirmed. There were no immediate procedural complications. The patient was transferred to the post catheterization recovery area for further monitoring.  Final Conclusions:  Successful closure of a fossa type atrial septal defect using a 30 mm cribriform ASO device.  Recommendations: Aspirin and Plavix x6 months, SBE prophylaxis x6 months, 2-D echocardiogram in the morning and chest x-ray in the morning. Anticipate discharge home tomorrow after her studies are completed.  Tonny BollmanMichael Loletta Harper 02/16/2014, 11:37 AM

## 2014-02-17 ENCOUNTER — Other Ambulatory Visit: Payer: Self-pay | Admitting: Cardiology

## 2014-02-17 ENCOUNTER — Ambulatory Visit (HOSPITAL_COMMUNITY): Payer: 59

## 2014-02-17 DIAGNOSIS — Q211 Atrial septal defect: Secondary | ICD-10-CM

## 2014-02-17 DIAGNOSIS — Q2111 Secundum atrial septal defect: Secondary | ICD-10-CM

## 2014-02-17 DIAGNOSIS — S301XXA Contusion of abdominal wall, initial encounter: Secondary | ICD-10-CM

## 2014-02-17 LAB — CBC
HCT: 31.9 % — ABNORMAL LOW (ref 36.0–46.0)
Hemoglobin: 9.7 g/dL — ABNORMAL LOW (ref 12.0–15.0)
MCH: 23.5 pg — ABNORMAL LOW (ref 26.0–34.0)
MCHC: 30.4 g/dL (ref 30.0–36.0)
MCV: 77.4 fL — ABNORMAL LOW (ref 78.0–100.0)
Platelets: 241 10*3/uL (ref 150–400)
RBC: 4.12 MIL/uL (ref 3.87–5.11)
RDW: 24.2 % — ABNORMAL HIGH (ref 11.5–15.5)
WBC: 7.1 10*3/uL (ref 4.0–10.5)

## 2014-02-17 MED ORDER — CLINDAMYCIN HCL 300 MG PO CAPS: 600.0000 mg | ORAL_CAPSULE | Freq: Once | ORAL | Status: DC

## 2014-02-17 MED ORDER — OXYCODONE-ACETAMINOPHEN 5-325 MG PO TABS: 1.0000 | ORAL_TABLET | ORAL | Status: DC | PRN

## 2014-02-17 MED ORDER — CLOPIDOGREL BISULFATE 75 MG PO TABS: 75.0000 mg | ORAL_TABLET | Freq: Every day | ORAL | Status: AC

## 2014-02-17 MED ORDER — ASPIRIN 81 MG PO TBEC: 81.0000 mg | DELAYED_RELEASE_TABLET | Freq: Every day | ORAL | Status: AC

## 2014-02-17 NOTE — Discharge Instructions (Signed)
Atrial Septal Defect An atrial septal defect (ASD) is a hole in the heart. This hole is located in the thin tissue (septum) that separates the two upper chambers of the heart, the right and left atrium. This hole is present at birth (congenital). A few minutes after birth, this hole normally closes so that blood is not able to go between the right and left atrium.  Normally, blood from the right side of the heart is pumped to the lungs where the blood is oxygenated. The oxygenated blood from the lungs is then pumped to the left side of the heart. From the left side of the heart, blood is pumped out to the rest of the body. When an ASD occurs, blood from the left atrium mixes with blood in the right atrium. The blood is then recirculated to the lungs and left side of the heart. In other words, the blood makes the trip twice. An ASD makes the heart work harder by increasing the amount of blood in the right side of the heart. This causes heart overload and eventually weakens the heart's ability to pump.  CAUSES  The cause of ASD is not known. SIGNS AND SYMPTOMS  The symptoms of ASD vary depending upon the size of the hole and the amount of blood that goes into the right atrium. There may be no symptoms or symptoms may include:  Tiredness or fatigue.  Trouble breathing or shortness of breath.  Irregular heartbeats (arrhythmias).  An extra "swishing" or "whooshing" type sound (heart murmur) heard when listening to the heart. DIAGNOSIS  In order to diagnose ASD, tests will need to be performed. Some of the tests may include:  ECG. This records the electrical activity of your heart and traces the patterns of your heartbeat.  Chest X-ray exam.  MRI or CT scan.  Nuclear medicine blood flow study. This imaging test shows how much blood is being passed through the ASD.  Echocardiography. There are two types that may be used:  Transthoracic echocardiography (TTE). A TTE is very sensitive for  detecting the two most common types of ASD, ostium primum or ostium secundum. It is not as sensitive in detecting a less common form of ASD, sinus venosus.  Transesophageal echocardiography (TEE). A TEE is especially helpful in patients who have a thin or easily movable (mobile) septum, making ASD detection more accurate.  Cardiac catheterization. In this procedure, a small tube (catheter) is passed through a large vein in your neck or groin or arm. With this test, your health care provider can visualize your heart defect, check how well your heart is pumping, and check the function of your heart valves. TREATMENT   No treatment may be required if only a small amount of blood is moving back and forth (shunting) from the left to right atrium.  Minimally invasive closure may be done depending on the type and location of the ASD. Similar to the cardiac catheterization used to diagnose an ASD, this type of procedure is done in a cardiac catheterization lab. A catheter is inserted into a large blood vessel. The catheter is advanced to the ASD in the heart. A patch resembling an umbrella is threaded up the catheter and placed in the ASD hole. The patch is then "opened up" to close off the hole.   SEEK IMMEDIATE MEDICAL CARE IF:  You experience unusual fatigue when exerting yourself.  You have chest pain at rest or with exertion.  You notice your fingertips or lips turning pale  or blue. MAKE SURE YOU:   Understand these instructions.   Will watch your condition.  Will get help right away if you are not doing well or get worse.  Document Released: 02/29/2004 Document Revised: 05/07/2013 Document Reviewed: 03/24/2013 [facility] Patient Information 2015 [place]. This information is not intended to replace advice given to you by your health care provider. Make sure you discuss any questions you have with your health care provider.   Antibiotic prophylaxis for penicillin allergic patients  as instructed x 6 months Aspirin 81 mg and Plavix 75 mg daily x 6 months

## 2014-02-17 NOTE — Progress Notes (Signed)
    Subjective:  Rt groin sore.  Objective:  Vital Signs in the last 24 hours: Temp:  [97.8 F (36.6 C)-98.4 F (36.9 C)] 98.4 F (36.9 C) (07/21 0540) Pulse Rate:  [76-106] 76 (07/21 0540) Resp:  [14-22] 20 (07/21 0540) BP: (64-143)/(32-97) 109/69 mmHg (07/21 0540) SpO2:  [96 %-100 %] 100 % (07/21 0540) Weight:  [217 lb 2.5 oz (98.5 kg)] 217 lb 2.5 oz (98.5 kg) (07/21 0540)  Intake/Output from previous day:  Intake/Output Summary (Last 24 hours) at 02/17/14 16100722 Last data filed at 02/17/14 0557  Gross per 24 hour  Intake   1284 ml  Output   1500 ml  Net   -216 ml    Physical Exam: General appearance: alert, cooperative, no distress and moderately obese Lungs: clear to auscultation bilaterally Heart: regular rate and rhythm Extremities: Rt groin ecchymotic and tender   Rate: 84  Rhythm: normal sinus rhythm  Lab Results:  Recent Labs  02/17/14 0537  WBC 7.1  HGB 9.7*  PLT 241   No results found for this basename: NA, K, CL, CO2, GLUCOSE, BUN, CREATININE,  in the last 72 hours No results found for this basename: TROPONINI, CK, MB,  in the last 72 hours No results found for this basename: INR,  in the last 72 hours  Imaging: Imaging results have been reviewed CXR this am pending  Cardiac Studies: Echo this am pending Assessment/Plan:   Principal Problem:   Acute CVA (cerebrovascular accident) Active Problems:   Ostium secundum type ASD- closed 02/16/14   Microcytic anemia   Hematoma of groin    PLAN: ASA Plavix x 6 months, SBE prophylaxis x 6 months. Home later today pending CXR and echo.  Corine ShelterLuke Kilroy PA-C [facility] [phone] 02/17/2014, 7:22 AM   Patient seen, examined. Available data reviewed. Agree with findings, assessment, and plan as outlined by Corine ShelterLuke Kilroy, PA-C. Lungs clear, heart RRR without murmur. Right groin with ecchymosis, small hematoma with tenderness, improved from yesterday. Vagal episode noted last pm. CXR reviewed and ASD closure  device in appropriate position. 2D Echo pending. OK for d/c today as long as echo looks ok. D/c instructions:   No strenuous lifting x 30 days  SBE prophylaxis x 6 months  ASA/plavix x 6 months  Follow-up 30 days with NP/PA and 2D Echo  Tonny BollmanMichael Arrow Emmerich, M.D. 02/17/2014 7:48 AM

## 2014-02-17 NOTE — Progress Notes (Signed)
  Echocardiogram 2D Echocardiogram has been performed.  [name] 02/17/2014, 9:01 AM

## 2014-02-17 NOTE — Discharge Summary (Signed)
Patient ID: [name],  MRN: [id number], DOB/AGE: 35/05/1981 35 y.o.  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 We were asked to see for TEE which was done 7/15 and revealed a medium size secundum type ASD. Closure was recommended. This was done 02/16/14 by Dr Excell Seltzerooper. The pt did have a painful hematoma of her Rt groin after this-but no evidence for pseudo aneurysm on exam. Follow up echo showed no effusion. CXR without acute changes. Dr Excell Seltzerooper feels she can be discharged. She will need SBE prophylaxis (PCN allergic) x 6 months, ASA 81 mg x 6 months, and Plavix x 6 months. She should be out of work for two weeks. She will be scheduled to have a follow up echo in 30 days and an office visit after that.    Discharge Vitals:  Blood pressure 141/98, pulse 99, temperature 98.2 F (36.8 C), temperature source Oral, resp. rate 18, height 5\' 4"  (1.626 m), weight 217 lb 2.5 oz (98.5 kg), last menstrual period 01/22/2014, SpO2 100.00%.    Labs: Results for orders placed during the hospital encounter of 02/16/14 (from the past 24 hour(s))  POCT ACTIVATED CLOTTING TIME     Status: None   Collection Time    02/16/14 11:08 AM      Result Value Ref Range   Activated Clotting Time 225    POCT ACTIVATED CLOTTING TIME     Status: None   Collection Time    02/16/14 11:28 AM      Result Value Ref Range   Activated Clotting Time 196    POCT ACTIVATED CLOTTING TIME     Status: None   Collection Time    02/16/14 12:07 PM      Result Value Ref Range   Activated Clotting Time 180      POCT ACTIVATED CLOTTING TIME     Status: None   Collection Time    02/16/14 12:24 PM      Result Value Ref Range   Activated Clotting Time 163    CBC     Status: Abnormal   Collection Time    02/17/14  5:37 AM      Result Value Ref Range   WBC 7.1  4.0 - 10.5 K/uL   RBC 4.12  3.87 - 5.11 MIL/uL   Hemoglobin 9.7 (*) 12.0 - 15.0 g/dL   HCT 40.931.9 (*) 81.136.0 - 91.446.0 %   MCV 77.4 (*) 78.0 - 100.0 fL   MCH 23.5 (*) 26.0 - 34.0 pg   MCHC 30.4  30.0 - 36.0 g/dL   RDW 78.224.2 (*) 95.611.5 - 21.315.5 %   Platelets 241  150 - 400 K/uL    Disposition:  Follow-up Information   Follow up with Tonny BollmanMichael Cooper, MD. (office will call you)    Specialty:  Cardiology   Contact information:   [address] [place] [address] [phone]       Discharge Medications:  Medication List         acetaminophen 500 MG tablet  Commonly known as:  TYLENOL  Take 1,000 mg by mouth every 6 (six) hours as needed for moderate pain.     ALPRAZolam 0.5 MG tablet  Commonly known as:  XANAX  Take 0.5 mg by mouth 3 (three) times daily as needed for anxiety.     aspirin 81 MG EC tablet  Take 1 tablet (81 mg total) by mouth daily.     clindamycin 300 MG capsule  Commonly known as:  CLEOCIN  Take 2 capsules (600 mg total) by mouth once.     clopidogrel 75 MG tablet  Commonly known as:  PLAVIX  Take 1 tablet (75 mg total) by mouth daily.     ferrous sulfate 325 (65 FE) MG EC tablet  Take 325 mg by mouth daily with breakfast.     multivitamin with minerals Tabs tablet  Take 1 tablet by mouth every morning.     oxyCODONE-acetaminophen 5-325 MG per tablet  Commonly known as:  PERCOCET/ROXICET  Take 1-2 tablets by mouth every 4 (four) hours as needed for moderate pain.     Vitamin D3 5000 UNITS Tabs  Take 5,000 Units by mouth every morning.         Duration of Discharge Encounter: Greater than 30 minutes including physician time.  Jolene Provost PA-C 02/17/2014 11:07 AM

## 2014-03-06 ENCOUNTER — Telehealth: Payer: Self-pay | Admitting: Cardiovascular Disease

## 2014-03-06 MED ORDER — METOPROLOL TARTRATE 25 MG PO TABS: 25.0000 mg | ORAL_TABLET | Freq: Every day | ORAL | Status: DC | PRN

## 2014-03-06 NOTE — Telephone Encounter (Signed)
Per Dr Excell Seltzerooper no further testing recommended at this time.  He felt the pt could try Metoprolol Tartrate 25mg  once a day as needed for palpitations.  Rx sent to pharmacy.  I left a detailed message on the pt's voicemail in regards to Dr Earmon Phoenixooper's recommendation.

## 2014-03-06 NOTE — Telephone Encounter (Signed)
New message           Pt would like to know if she would feel anything different from her ASB procedure / pt is feeling palpitations is this normal

## 2014-03-06 NOTE — Telephone Encounter (Signed)
I spoke with the pt and she complains of random palpitations.  The pt said she gets the sensation of fluttering in her chest, chest tightness, heart racing and a lump in her throat.  This does not occur on a daily basis. I will forward this message to Dr Excell Seltzerooper to review and make further recommendations.

## 2014-03-18 ENCOUNTER — Ambulatory Visit (HOSPITAL_COMMUNITY): Payer: 59 | Attending: Cardiovascular Disease | Admitting: Radiology

## 2014-03-18 DIAGNOSIS — Q2111 Secundum atrial septal defect: Secondary | ICD-10-CM | POA: Diagnosis present

## 2014-03-18 DIAGNOSIS — Q211 Atrial septal defect: Secondary | ICD-10-CM

## 2014-03-18 NOTE — Progress Notes (Signed)
Echocardiogram performed.  

## 2014-03-23 ENCOUNTER — Encounter: Payer: Self-pay | Admitting: Physician Assistant

## 2014-03-23 ENCOUNTER — Ambulatory Visit (INDEPENDENT_AMBULATORY_CARE_PROVIDER_SITE_OTHER): Payer: 59 | Admitting: Physician Assistant

## 2014-03-23 VITALS — BP 132/91 | HR 95 | Ht 63.0 in | Wt 222.0 lb

## 2014-03-23 DIAGNOSIS — I10 Essential (primary) hypertension: Secondary | ICD-10-CM

## 2014-03-23 DIAGNOSIS — I639 Cerebral infarction, unspecified: Secondary | ICD-10-CM

## 2014-03-23 DIAGNOSIS — F411 Generalized anxiety disorder: Secondary | ICD-10-CM

## 2014-03-23 DIAGNOSIS — Q2111 Secundum atrial septal defect: Secondary | ICD-10-CM

## 2014-03-23 DIAGNOSIS — Q211 Atrial septal defect: Secondary | ICD-10-CM

## 2014-03-23 DIAGNOSIS — S301XXS Contusion of abdominal wall, sequela: Secondary | ICD-10-CM

## 2014-03-23 DIAGNOSIS — F419 Anxiety disorder, unspecified: Secondary | ICD-10-CM | POA: Insufficient documentation

## 2014-03-23 DIAGNOSIS — I6350 Cerebral infarction due to unspecified occlusion or stenosis of unspecified cerebral artery: Secondary | ICD-10-CM

## 2014-03-23 NOTE — Assessment & Plan Note (Signed)
Patient is having a lot of trouble with anxiety. She takes Xanax. She is seeing her primary M.D. later this week for further treatment. No further palpitations.

## 2014-03-23 NOTE — Assessment & Plan Note (Signed)
Followup 2-D echo on 03/18/14 showed closure and no atrial level shunt. Followup with Dr. Excell Seltzer in 4-6 weeks

## 2014-03-23 NOTE — Assessment & Plan Note (Signed)
Resolved

## 2014-03-23 NOTE — Assessment & Plan Note (Signed)
Patient is still having some visual problems since her CVA. I encouraged her to make an appointment with her ophthalmologist.

## 2014-03-23 NOTE — Progress Notes (Signed)
Per Dr Excell Seltzer the pt does not need follow-up in 4-6 WEEKS.  Will plan to repeat Echocardiogram and Office visit in 6 MONTHS.

## 2014-03-23 NOTE — Patient Instructions (Addendum)
Your physician wants you to follow-up in: 6 MONTHS with Dr Excell Seltzer.  You will receive a reminder letter in the mail two months in advance. If you don't receive a letter, please call our office to schedule the follow-up appointment.  Your physician has requested that you have an echocardiogram in 6 MONTHS. Echocardiography is a painless test that uses sound waves to create images of your heart. It provides your doctor with information about the size and shape of your heart and how well your heart's chambers and valves are working. This procedure takes approximately one hour. There are no restrictions for this procedure.  Your physician recommends that you continue on your current medications as directed. Please refer to the Current Medication list given to you today.   Cardiac Diet This diet can help prevent heart disease and stroke. Many factors influence your heart health, including eating and exercise habits. Coronary risk rises a lot with abnormal blood fat (lipid) levels. Cardiac meal planning includes limiting unhealthy fats, increasing healthy fats, and making other small dietary changes. General guidelines are as follows:  Adjust calorie intake to reach and maintain desirable body weight.  Limit total fat intake to less than 30% of total calories. Saturated fat should be less than 7% of calories.  Saturated fats are found in animal products and in some vegetable products. Saturated vegetable fats are found in coconut oil, cocoa butter, palm oil, and palm kernel oil. Read labels carefully to avoid these products as much as possible. Use butter in moderation. Choose tub margarines and oils that have 2 grams of fat or less. Good cooking oils are canola and olive oils.  Practice low-fat cooking techniques. Do not fry food. Instead, broil, bake, boil, steam, grill, roast on a rack, stir-fry, or microwave it. Other fat reducing suggestions include:  Remove the skin from poultry.  Remove all  visible fat from meats.  Skim the fat off stews, soups, and gravies before serving them.  Steam vegetables in water or broth instead of sauting them in fat.  Avoid foods with trans fat (or hydrogenated oils), such as commercially fried foods and commercially baked goods. Commercial shortening and deep-frying fats will contain trans fat.  Increase intake of fruits, vegetables, whole grains, and legumes to replace foods high in fat.  Increase consumption of nuts, legumes, and seeds to at least 4 servings weekly. One serving of a legume equals  cup, and 1 serving of nuts or seeds equals  cup.  Choose whole grains more often. Have 3 servings per day (a serving is 1 ounce [oz]).  Eat 4 to 5 servings of vegetables per day. A serving of vegetables is 1 cup of raw leafy vegetables;  cup of raw or cooked cut-up vegetables;  cup of vegetable juice.  Eat 4 to 5 servings of fruit per day. A serving of fruit is 1 medium whole fruit;  cup of dried fruit;  cup of fresh, frozen, or canned fruit;  cup of 100% fruit juice.  Increase your intake of dietary fiber to 20 to 30 grams per day. Insoluble fiber may help lower your risk of heart disease and may help curb your appetite.  Soluble fiber binds cholesterol to be removed from the blood. Foods high in soluble fiber are dried beans, citrus fruits, oats, apples, bananas, broccoli, Brussels sprouts, and eggplant.  Try to include foods fortified with plant sterols or stanols, such as yogurt, breads, juices, or margarines. Choose several fortified foods to achieve a daily intake  of 2 to 3 grams of plant sterols or stanols.  Foods with omega-3 fats can help reduce your risk of heart disease. Aim to have a 3.5 oz portion of fatty fish twice per week, such as salmon, mackerel, albacore tuna, sardines, lake trout, or herring. If you wish to take a fish oil supplement, choose one that contains 1 gram of both DHA and EPA.  Limit processed meats to 2 servings (3  oz portion) weekly.  Limit the sodium in your diet to 1500 milligrams (mg) per day. If you have high blood pressure, talk to a registered dietitian about a DASH (Dietary Approaches to Stop Hypertension) eating plan.  Limit sweets and beverages with added sugar, such as soda, to no more than 5 servings per week. One serving is:   1 tablespoon sugar.  1 tablespoon jelly or jam.   cup sorbet.  1 cup lemonade.   cup regular soda. CHOOSING FOODS Starches  Allowed: Breads: All kinds (wheat, rye, raisin, white, oatmeal, [place], [place], and English muffin bread). Low-fat rolls: English muffins, frankfurter and hamburger buns, bagels, pita bread, tortillas (not fried). Pancakes, waffles, biscuits, and muffins made with recommended oil.  Avoid: Products made with saturated or trans fats, oils, or whole milk products. Butter rolls, cheese breads, croissants. Commercial doughnuts, muffins, sweet rolls, biscuits, waffles, pancakes, store-bought mixes. Crackers  Allowed: Low-fat crackers and snacks: Animal, graham, rye, saltine (with recommended oil, no lard), oyster, and matzo crackers. Bread sticks, melba toast, rusks, flatbread, pretzels, and light popcorn.  Avoid: High-fat crackers: cheese crackers, butter crackers, and those made with coconut, palm oil, or trans fat (hydrogenated oils). Buttered popcorn. Cereals  Allowed: Hot or cold whole-grain cereals.  Avoid: Cereals containing coconut, hydrogenated vegetable fat, or animal fat. Potatoes / Pasta / Rice  Allowed: All kinds of potatoes, rice, and pasta (such as macaroni, spaghetti, and noodles).  Avoid: Pasta or rice prepared with cream sauce or high-fat cheese. Chow mein noodles, [place] fries. Vegetables  Allowed: All vegetables and vegetable juices.  Avoid: Fried vegetables. Vegetables in cream, butter, or high-fat cheese sauces. Limit coconut. Fruit in cream or custard. Protein  Allowed: Limit your intake of meat, seafood,  and poultry to no more than 6 oz (cooked weight) per day. All lean, well-trimmed beef, veal, pork, and lamb. All chicken and [place] without skin. All fish and shellfish. Wild game: wild duck, rabbit, pheasant, and venison. Egg whites or low-cholesterol egg substitutes may be used as desired. Meatless dishes: recipes with dried beans, peas, lentils, and tofu (soybean curd). Seeds and nuts: all seeds and most nuts.  Avoid: Prime grade and other heavily marbled and fatty meats, such as short ribs, spare ribs, rib eye roast or steak, frankfurters, sausage, bacon, and high-fat luncheon meats, mutton. Caviar. Commercially fried fish. Domestic duck, goose, venison sausage. Organ meats: liver, gizzard, heart, chitterlings, brains, kidney, sweetbreads. Dairy  Allowed: Low-fat cheeses: nonfat or low-fat cottage cheese (1% or 2% fat), cheeses made with part skim milk, such as mozzarella, farmers, string, or ricotta. (Cheeses should be labeled no more than 2 to 6 grams fat per oz.). Skim (or 1%) milk: liquid, powdered, or evaporated. Buttermilk made with low-fat milk. Drinks made with skim or low-fat milk or cocoa. Chocolate milk or cocoa made with skim or low-fat (1%) milk. Nonfat or low-fat yogurt.  Avoid: Whole milk cheeses, including colby, cheddar, [place], [address], [place], [place], [place], [address], [place], and blue. Creamed cottage cheese, cream cheese. Whole milk and whole milk products, including  buttermilk or yogurt made from whole milk, drinks made from whole milk. Condensed milk, evaporated whole milk, and 2% milk. Soups and Combination Foods  Allowed: Low-fat low-sodium soups: broth, dehydrated soups, homemade broth, soups with the fat removed, homemade cream soups made with skim or low-fat milk. Low-fat spaghetti, lasagna, chili, and Spanish rice if low-fat ingredients and low-fat cooking techniques are used.  Avoid: Cream soups made with whole milk, cream, or high-fat cheese. All other  soups. Desserts and Sweets  Allowed: Sherbet, fruit ices, gelatins, meringues, and angel food cake. Homemade desserts with recommended fats, oils, and milk products. Jam, jelly, honey, marmalade, sugars, and syrups. Pure sugar candy, such as gum drops, hard candy, jelly beans, marshmallows, mints, and small amounts of dark chocolate.  Avoid: Commercially prepared cakes, pies, cookies, frosting, pudding, or mixes for these products. Desserts containing whole milk products, chocolate, coconut, lard, palm oil, or palm kernel oil. Ice cream or ice cream drinks. Candy that contains chocolate, coconut, butter, hydrogenated fat, or unknown ingredients. Buttered syrups. Fats and Oils  Allowed: Vegetable oils: safflower, sunflower, corn, soybean, cottonseed, sesame, canola, olive, or peanut. Non-hydrogenated margarines. Salad dressing or mayonnaise: homemade or commercial, made with a recommended oil. Low or nonfat salad dressing or mayonnaise.  Limit added fats and oils to 6 to 8 tsp per day (includes fats used in cooking, baking, salads, and spreads on bread). Remember to count the "hidden fats" in foods.  Avoid: Solid fats and shortenings: butter, lard, salt pork, bacon drippings. Gravy containing meat fat, shortening, or suet. Cocoa butter, coconut. Coconut oil, palm oil, palm kernel oil, or hydrogenated oils: these ingredients are often used in bakery products, nondairy creamers, whipped toppings, candy, and commercially fried foods. Read labels carefully. Salad dressings made of unknown oils, sour cream, or cheese, such as blue cheese and [place]. Cream, all kinds: half-and-half, light, heavy, or whipping. Sour cream or cream cheese (even if "light" or low-fat). Nondairy cream substitutes: coffee creamers and sour cream substitutes made with palm, palm kernel, hydrogenated oils, or coconut oil. Beverages  Allowed: Coffee (regular or decaffeinated), tea. Diet carbonated beverages, mineral water.  Alcohol: Check with your caregiver. Moderation is recommended.  Avoid: Whole milk, regular sodas, and juice drinks with added sugar. Condiments  Allowed: All seasonings and condiments. Cocoa powder. "Cream" sauces made with recommended ingredients.  Avoid: Carob powder made with hydrogenated fats. SAMPLE MENU Breakfast   cup orange juice   cup oatmeal  1 slice toast  1 tsp margarine  1 cup skim milk Lunch  [place] sandwich with 2 oz [place], 2 slices bread  Lettuce and tomato slices  Fresh fruit  Carrot sticks  Coffee or tea Snack  Fresh fruit or low-fat crackers Dinner  3 oz lean ground beef  1 baked potato  1 tsp margarine   cup asparagus  Lettuce salad  1 tbs non-creamy dressing   cup peach slices  1 cup skim milk Document Released: 04/25/2008 Document Revised: 01/16/2012 Document Reviewed: 09/16/2013 Exit[employer] Patient Information 2015 [place], [place]. This information is not intended to replace advice given to you by your health care provider. Make sure you discuss any questions you have with your health care provider.

## 2014-03-23 NOTE — Progress Notes (Signed)
HPI: This is a 33 year old female patient of Dr. Excell Seltzer with history of TIA who was admitted on 02/06/14 with CVA by MRI. TEE on 7/15 showed a medium sized second him type ASD. She underwent closure by Dr. Excell Seltzer on 02/16/14. She will need SBE prophylaxis for 6 months, aspirin 81 mg for 6 months and Plavix for 6 months. Followup 2-D echo on 03/18/14 showed closure of the ASD with no shunt.  Patient is doing well post procedure. She has recovered all her eyesight from her CVA but is having some trouble with her glasses. She's also having trouble with anxiety. She called in complaining of palpitations and the metoprolol was prescribed when necessary. She has not taken any of this because she says it's all anxiety related. She is seeing her primary M.D. later this week for further treatment of this. She is taking Xanax for now. She denies any chest pain, palpitations, dyspnea, dyspnea on exertion, dizziness, or presyncope.  Allergies  Allergen Reactions  . Strawberry Shortness Of Breath and Swelling    Tongue swelling  . Watermelon [Citrullus Vulgaris] Shortness Of Breath and Swelling  . Citalopram Hydrobromide Other (See Comments)    Made pt feel crazy  . Erythromycin Nausea And Vomiting  . Penicillins Hives, Itching and Rash    All penicillin derivatives as well.      Current Outpatient Prescriptions  Medication Sig Dispense Refill  . acetaminophen (TYLENOL) 500 MG tablet Take 1,000 mg by mouth every 6 (six) hours as needed for moderate pain.      Marland Kitchen ALPRAZolam (XANAX) 0.5 MG tablet Take 0.5 mg by mouth 3 (three) times daily as needed for anxiety.       Marland Kitchen aspirin 81 MG EC tablet Take 1 tablet (81 mg total) by mouth daily.  30 tablet  0  . Cholecalciferol (VITAMIN D3) 5000 UNITS TABS Take 5,000 Units by mouth every morning.       . clindamycin (CLEOCIN) 300 MG capsule Take 2 capsules (600 mg total) by mouth once.  1 capsule  2  . clopidogrel (PLAVIX) 75 MG tablet Take 1 tablet (75 mg  total) by mouth daily.  30 tablet  8  . ferrous sulfate 325 (65 FE) MG EC tablet Take 325 mg by mouth daily with breakfast.      . metoprolol tartrate (LOPRESSOR) 25 MG tablet Take 1 tablet (25 mg total) by mouth daily as needed (for palpitations).  30 tablet  3  . Multiple Vitamin (MULTIVITAMIN WITH MINERALS) TABS tablet Take 1 tablet by mouth every morning.       Marland Kitchen oxyCODONE-acetaminophen (PERCOCET/ROXICET) 5-325 MG per tablet Take 1-2 tablets by mouth every 4 (four) hours as needed for moderate pain.  20 tablet  0   No current facility-administered medications for this visit.    Past Medical History  Diagnosis Date  . TIA (transient ischemic attack) 02/07/2010  . Stroke ?; ?; 02/06/2014    "on 02/06/2014 & MRI showed ~ 2 others that I didn't know of"; denies residual on 02/16/2014  . Anxiety 2013    "after daughter was born I started RX prn"    Past Surgical History  Procedure Laterality Date  . Cesarean section  2003, 2012  . Tee without cardioversion N/A 02/11/2014    Procedure: TRANSESOPHAGEAL ECHOCARDIOGRAM (TEE);  Surgeon: Thurmon Fair, MD;  Location: [facility] ENDOSCOPY;  Service: Cardiovascular;  Laterality: N/A;  . Asd repair  02/16/2014  . Dilation and curettage of uterus  History reviewed. No pertinent family history.  History   Social History  . Marital Status: Married    Spouse Name: N/A    Number of Children: N/A  . Years of Education: N/A   Occupational History  . Not on file.   Social History Main Topics  . Smoking status: Never Smoker   . Smokeless tobacco: Never Used  . Alcohol Use: No  . Drug Use: No  . Sexual Activity: Yes   Other Topics Concern  . Not on file   Social History Narrative  . No narrative on file    ROS: See history of present illness otherwise negative  BP 132/91  Pulse 95  Ht  (1.6 m)  Wt 222 lb (100.699 kg)  BMI 39.34 kg/m2  PHYSICAL EXAM: Obese, in no acute distress. Neck: No JVD, HJR, Bruit, or thyroid  enlargement  Lungs: No tachypnea, clear without wheezing, rales, or rhonchi  Cardiovascular: RRR, PMI not displaced, 1/6 systolic murmur at the left sternal border, no gallops, bruit, thrill, or heave.  Abdomen: BS normal. Soft without organomegaly, masses, lesions or tenderness.  Extremities: Right groin without evidence of hematoma or hemorrhage at cath site, otherwise lower extremities without cyanosis, clubbing or edema. Good distal pulses bilateral  SKin: Warm, no lesions or rashes   Musculoskeletal: No deformities  Neuro: no focal signs   Wt Readings from Last 3 Encounters:  02/17/14 217 lb 2.5 oz (98.5 kg)  02/17/14 217 lb 2.5 oz (98.5 kg)  02/12/14 221 lb 12.8 oz (100.608 kg)     EKG: normal sinus rhythm at 95 beats per minute, no acute change  2-D echo 03/18/14 Study Conclusions  - Left ventricle: The cavity size was normal. Systolic function was normal. The estimated ejection fraction was in the range of 55% to 60%. Wall motion was normal; there were no regional wall motion abnormalities. Left ventricular diastolic function parameters were normal. - Atrial septum: An Amplatzer closure device in the fossa ovalis region was present. Doppler showed no atrial level shunt.

## 2014-03-23 NOTE — Assessment & Plan Note (Signed)
Patient's blood pressure is up today. She says she is always anxious when she goes to the doctor's office. Otherwise it is usually down. Continue to monitor.

## 2014-06-01 ENCOUNTER — Telehealth: Payer: Self-pay | Admitting: Cardiovascular Disease

## 2014-06-01 DIAGNOSIS — Q2111 Secundum atrial septal defect: Secondary | ICD-10-CM

## 2014-06-01 DIAGNOSIS — Q211 Atrial septal defect: Secondary | ICD-10-CM

## 2014-06-01 NOTE — Telephone Encounter (Signed)
Order placed for Echocardiogram 

## 2014-06-01 NOTE — Telephone Encounter (Signed)
New problem   Pt need order for echo same day as appt w/cooper 09/03/14.

## 2014-06-03 ENCOUNTER — Other Ambulatory Visit (HOSPITAL_COMMUNITY): Payer: 59

## 2014-07-09 ENCOUNTER — Encounter (HOSPITAL_COMMUNITY): Payer: Self-pay | Admitting: Cardiovascular Disease

## 2014-09-02 NOTE — Progress Notes (Signed)
Cardiology Office Note   Date:  09/03/2014   ID:  [name], DOB 04-10-1981, MRN [id number]  PCP:  Johny BlamerHARRIS, WILLIAM, MD  Cardiologist:  Tonny BollmanMichael Sonakshi Rolland, MD    Chief Complaint  Patient presents with  . Follow-up     History of Present Illness: [name] is a 34 y.o. female who presents for follow-up after transcatheter ASD closure 7.20.2015. The patient presented with an acute stroke in June 2015 with MRI demonstrating multiple areas of infarction c/w a cardiac source of embolus. She was ultimately diagnosed with a fossa-type ASD and underwent closure with a 30 mm ASO device. She presents today for follow-up.  The patient has been having a lot of anxiety about this appointment. She reports easy bruising. Really eager to stop clopidogrel. Otherwise doing well. No chest pain or pressure, shortness of breath, or leg swelling. She's had difficulty tolerating caffeine because of tachypalpitations.    Past Medical History  Diagnosis Date  . TIA (transient ischemic attack) 02/07/2010  . Stroke ?; ?; 02/06/2014    "on 02/06/2014 & MRI showed ~ 2 others that I didn't know of"; denies residual on 02/16/2014  . Anxiety 2013    "after daughter was born I started RX prn"    Past Surgical History  Procedure Laterality Date  . Cesarean section  2003, 2012  . Tee without cardioversion N/A 02/11/2014    Procedure: TRANSESOPHAGEAL ECHOCARDIOGRAM (TEE);  Surgeon: Thurmon FairMihai Croitoru, MD;  [facility];  Service: Cardiovascular;  Laterality: N/A;  . Asd repair  02/16/2014  . Dilation and curettage of uterus    . Asd repair N/A 02/16/2014    Procedure: ATRIAL SEPTAL DEFECT (ASD) REPAIR;  Surgeon: Micheline ChapmanMichael D Tran Randle, MD;  [facility];  Service: Cardiovascular;  Laterality: N/A;    Current Outpatient Prescriptions  Medication Sig Dispense Refill  . ALPRAZolam (XANAX) 0.5 MG tablet Take 0.5 mg by mouth 3 (three) times daily as needed for anxiety.     Marland Kitchen. aspirin 81 MG tablet Take  81 mg by mouth daily.    . ferrous sulfate 325 (65 FE) MG EC tablet Take 325 mg by mouth continuous as needed (Pt taking  one tablet daily  by mouth when she is menstruating).     . Multiple Vitamin (MULTIVITAMIN WITH MINERALS) TABS tablet Take 1 tablet by mouth every morning.     . Vitamin D, Ergocalciferol, (DRISDOL) 50000 UNITS CAPS capsule Take 50,000 Units by mouth daily. Taking 5000 iu ddaily     No current facility-administered medications for this visit.    Allergies:   Strawberry; Watermelon; Citalopram hydrobromide; Erythromycin; and Penicillins   Social History:  The patient  reports that she has never smoked. She has never used smokeless tobacco. She reports that she does not drink alcohol or use illicit drugs.   Family History:  The patient's  family history is not on file.    ROS:  Please see the history of present illness.  Otherwise, review of systems is positive for easy bruising and anxiety.  All other systems are reviewed and negative.   PHYSICAL EXAM: VS:  BP 118/84 mmHg  Ht 5\' 4"  (1.626 m)  Wt 226 lb 12.8 oz (102.876 kg)  BMI 38.91 kg/m2 , BMI Body mass index is 38.91 kg/(m^2). GEN: Well nourished, well developed, in no acute distress HEENT: normal Neck: no JVD, carotid bruits, or masses Cardiac: RRR without murmur or gallop  No peripheral edema Respiratory:  clear to auscultation bilaterally, normal work of breathing GI: soft, nontender, nondistended, + BS MS: no deformity or atrophy Skin: warm and dry, no rash Neuro:  Strength and sensation are intact Psych: euthymic mood, full affect  EKG:  EKG is not ordered today.  Recent Labs: 02/07/2014: ALT 17; BUN 8; Creatinine 0.65; Potassium 4.2; Sodium 142 02/17/2014: Hemoglobin 9.7*; Platelets 241   Lipid Panel     Component Value Date/Time   CHOL 128 02/07/2014 0355   TRIG 106 02/07/2014 0355   HDL 39* 02/07/2014 0355   CHOLHDL 3.3 02/07/2014 0355   VLDL 21 02/07/2014 0355   LDLCALC 68  02/07/2014 0355      Wt Readings from Last 3 Encounters:  09/03/14 226 lb 12.8 oz (102.876 kg)  03/23/14 222 lb (100.699 kg)  02/17/14 217 lb 2.5 oz (98.5 kg)     Cardiac Studies Reviewed: 2-D echocardiogram images reviewed. Formal report is currently pending. By my review, LV function is normal. There is no significant valvular disease. The atrial septal occluder devices in appropriate position with no color-flow visualized across the device. Is no paracardial effusion.  ASSESSMENT AND PLAN: 1.  Secundum ASD s/p transcatheter closure: The patient is stable now 6 months out from her procedure. Per protocol, she can discontinue Plavix. SBE prophylaxis is no longer required. I would like to see her back in 6 months with a limited echocardiogram and bubble study. As long as things are stable we will change to annual follow-up at that point.  2. Stroke: Recommend that she continue on long-term aspirin at low dose of 81 mg daily. She has fully recovered.  Current medicines are reviewed with the patient today.  The patient does not have concerns regarding medicines.  The following changes have been made:  Stop Plavix  Labs/ tests ordered today include:   Orders Placed This Encounter  Procedures  . 2D Echocardiogram with bubble study    Disposition:   FU in 6 months with a limited echo bubble study  Signed, Tonny Bollman, MD  09/03/2014 11:29 AM    [facility] [address] [place], [place], [place]  [address] Phone: [phone]; Fax: [phone]

## 2014-09-03 ENCOUNTER — Encounter: Payer: Self-pay | Admitting: Cardiovascular Disease

## 2014-09-03 ENCOUNTER — Ambulatory Visit (HOSPITAL_COMMUNITY): Payer: 59 | Attending: Cardiology

## 2014-09-03 ENCOUNTER — Ambulatory Visit (INDEPENDENT_AMBULATORY_CARE_PROVIDER_SITE_OTHER): Payer: 59 | Admitting: Cardiovascular Disease

## 2014-09-03 VITALS — BP 118/84 | Ht 64.0 in | Wt 226.8 lb

## 2014-09-03 DIAGNOSIS — Z8774 Personal history of (corrected) congenital malformations of heart and circulatory system: Secondary | ICD-10-CM

## 2014-09-03 DIAGNOSIS — Q211 Atrial septal defect: Secondary | ICD-10-CM | POA: Insufficient documentation

## 2014-09-03 DIAGNOSIS — Z9889 Other specified postprocedural states: Secondary | ICD-10-CM

## 2014-09-03 DIAGNOSIS — Q2111 Secundum atrial septal defect: Secondary | ICD-10-CM

## 2014-09-03 NOTE — Progress Notes (Signed)
2D Echo completed. 09/03/2014 

## 2014-09-03 NOTE — Patient Instructions (Signed)
Your physician has requested that you have a LIMITED ECHOCARDIOGRAM WITH BUBBLE STUDY in 6 MONTHS. Echocardiography is a painless test that uses sound waves to create images of your heart. It provides your doctor with information about the size and shape of your heart and how well your heart's chambers and valves are working. This procedure takes approximately one hour. There are no restrictions for this procedure.  Your physician wants you to follow-up in: 6 MONTHS with Dr Excell Seltzerooper. You will receive a reminder letter in the mail two months in advance. If you don't receive a letter, please call our office to schedule the follow-up appointment.  Your physician has recommended you make the following change in your medication:  1. STOP Plavix (clopidogrel)

## 2014-09-07 ENCOUNTER — Telehealth: Payer: Self-pay | Admitting: Cardiovascular Disease

## 2014-09-07 NOTE — Telephone Encounter (Signed)
I spoke with the pt and made her aware of the final report on Echocardiogram.  I advised the pt that plavix is not a medication that she needs to be weaned off.  The pt said her friends told her that she should be weaned off of blood thinning medications and this made her anxious.  The pt stopped the medication last week and I made her aware that she did follow instructions correctly and just needed to stop plavix.

## 2014-09-07 NOTE — Telephone Encounter (Signed)
New message    Patient calling   Pt C/O medication issue:  1. Name of Medication: Plavix    2. How are you currently taking this medication (dosage and times per day)? Discontinue as of 09/03/14. - was taken one a day for 7 months   3. Are you having a reaction (difficulty breathing--STAT)? Overly anxious   4. What is your medication issue? Wants to know why she was not ween off medication prior to stopping medication.

## 2014-09-08 ENCOUNTER — Other Ambulatory Visit: Payer: Self-pay | Admitting: Cardiology

## 2014-09-08 NOTE — Telephone Encounter (Signed)
Attempted to call back, put on hold for several minutes. Will call back later.

## 2014-09-08 NOTE — Telephone Encounter (Signed)
Please call,questio about her Clindamycin prescription from 02-17-14.

## 2014-09-09 NOTE — Telephone Encounter (Signed)
Pharmacist called in stating that the pt's prescription for Clindamycin does not have a quanitity and would like to get that. Please f/u  Thanks

## 2014-09-09 NOTE — Telephone Encounter (Signed)
Pharmacy needed clarification on order. Information given to pharmacy, asked to close prescription as this was for a hospital encounter from last year. Encounter closed.

## 2014-09-09 NOTE — Telephone Encounter (Signed)
Left message for pharmacy staff

## 2014-09-21 ENCOUNTER — Ambulatory Visit: Payer: 59 | Admitting: Cardiology

## 2014-09-25 ENCOUNTER — Telehealth: Payer: Self-pay | Admitting: Cardiovascular Disease

## 2014-09-25 NOTE — Telephone Encounter (Signed)
Spoke with dental office and let them know as she is > 6 months out from ASD repair no need for SBE prophylaxis

## 2014-09-25 NOTE — Telephone Encounter (Signed)
New message      1. What dental office are you calling from? Dr Ane PaymentHooker  2. What is your office phone and fax number? [phone]  3. What type of procedure is the patient having performed? cleaning  4. What date is procedure scheduled? IN CHAIR NOW  5. What is your question (ex. Antibiotics prior to procedure, holding medication-we need to know how long dentist wants pt to hold med)?  Pt had a valve repair in June 2015

## 2015-01-06 ENCOUNTER — Telehealth: Payer: Self-pay | Admitting: Cardiovascular Disease

## 2015-01-06 NOTE — Telephone Encounter (Signed)
New Prob   Pt c/o medication issue:  1. Name of Medication: Wellbutrin   2. How are you currently taking this medication (dosage and times per day)? 150 mg once daily  3. Are you having a reaction (difficulty breathing--STAT)? Palpitations   4. What is your medication issue? Pt was recently started on this medication and calling to make sure it is safe for her to take.

## 2015-01-06 NOTE — Telephone Encounter (Signed)
I spoke with the pt and she has spoken with her PCP since her call to our office earlier this morning. The pt was started on Wellbutrin 12/19/14 by Dr Tiburcio PeaHarris.  He made the pt aware that she may have side effects from this medication so he had the pt take one tablet every other day for one week and then increase to once a day.  Dr Tiburcio PeaHarris has already advised the pt to stop this medication due to palpitations.  I advised the pt to see if her palpitations resolve after stopping Wellbutrin. If the pt continues to have problems after a few weeks she will contact our office. Pt agreed with plan.

## 2015-05-21 IMAGING — CR DG CHEST 2V
2 series · 2 of 2 positions shown · non-contrast
Comparison: None.

CLINICAL DATA: ASD closure.

EXAM:
CHEST  2 VIEW

[w chest lat]
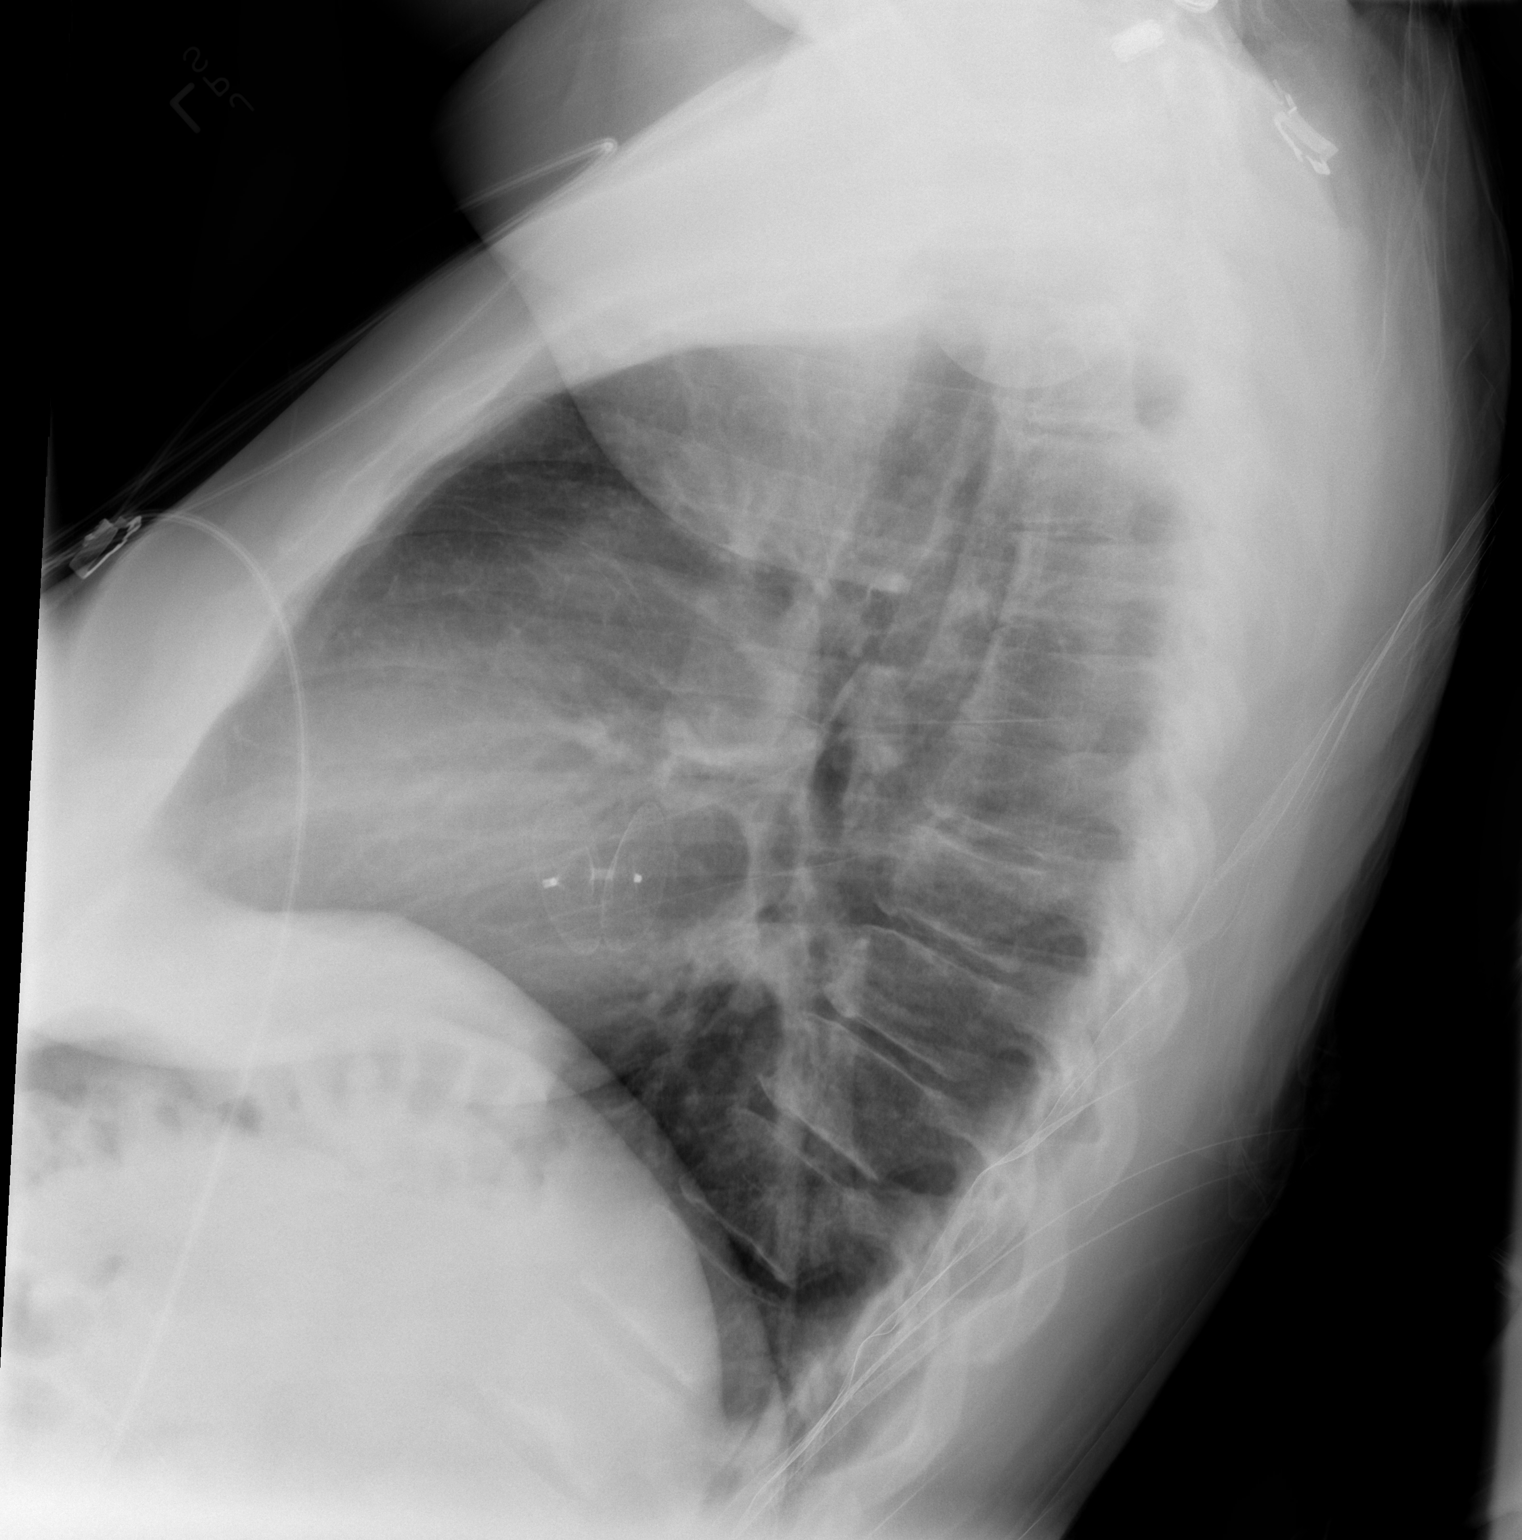

[w chest pa]
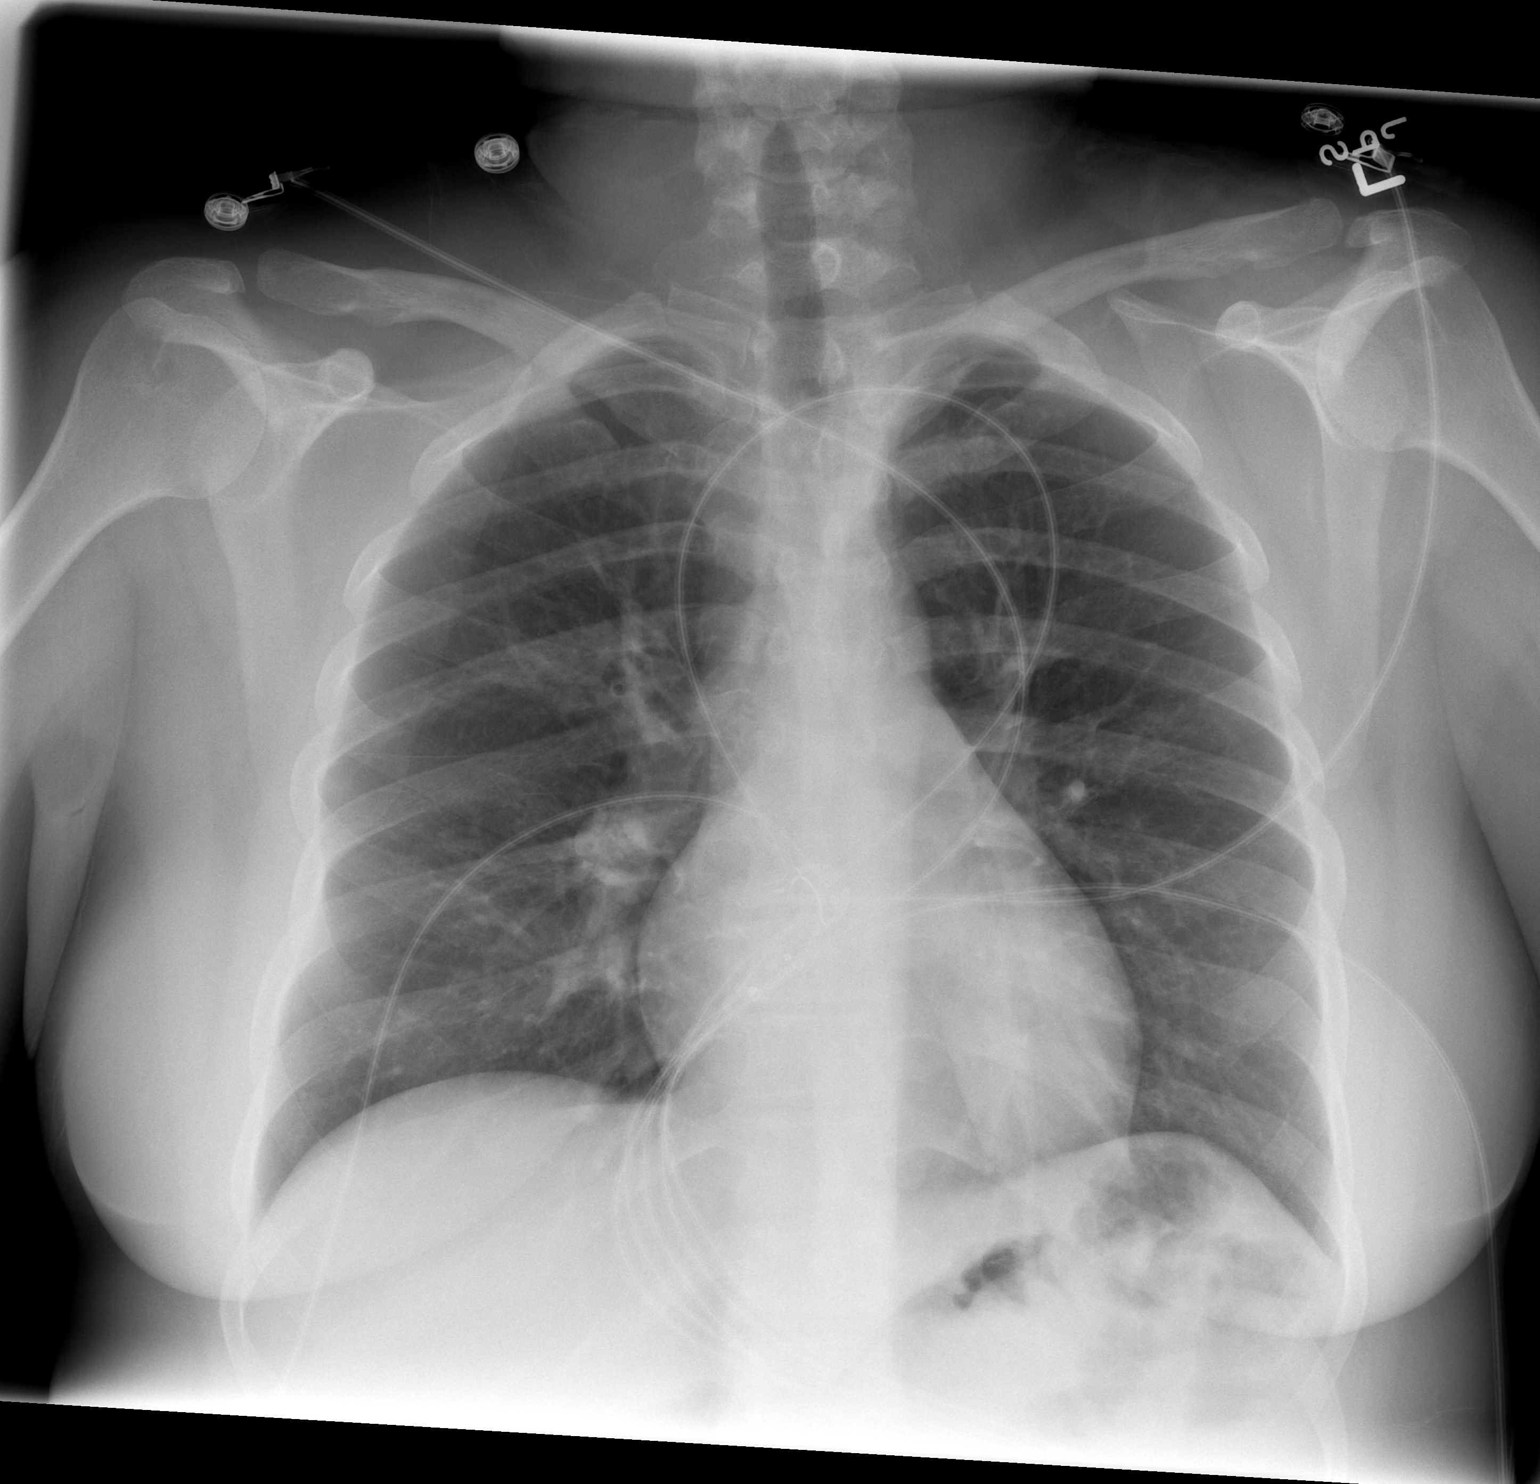

[2 of 2 positions shown; findings below may reference images not displayed]

FINDINGS: Mediastinum and hilar structures normal. ASD closure device noted
projected over the heart. Heart size normal. Pulmonary vascularity
normal. No pleural effusion or pneumothorax. No acute osseus
abnormality.
IMPRESSION: 1. ASD closure device noted projected over the heart.
2. No acute cardiopulmonary disease.

## 2015-09-03 ENCOUNTER — Ambulatory Visit (HOSPITAL_COMMUNITY): Payer: Managed Care, Other (non HMO) | Attending: Cardiovascular Disease

## 2015-09-03 ENCOUNTER — Other Ambulatory Visit: Payer: Self-pay

## 2015-09-03 ENCOUNTER — Other Ambulatory Visit: Payer: Self-pay | Admitting: Cardiovascular Disease

## 2015-09-03 DIAGNOSIS — Z8774 Personal history of (corrected) congenital malformations of heart and circulatory system: Secondary | ICD-10-CM | POA: Insufficient documentation

## 2015-09-03 DIAGNOSIS — I517 Cardiomegaly: Secondary | ICD-10-CM | POA: Insufficient documentation

## 2015-09-03 DIAGNOSIS — Q2111 Secundum atrial septal defect: Secondary | ICD-10-CM

## 2015-09-03 DIAGNOSIS — Q211 Atrial septal defect: Secondary | ICD-10-CM | POA: Insufficient documentation

## 2015-09-03 DIAGNOSIS — Z9889 Other specified postprocedural states: Secondary | ICD-10-CM | POA: Diagnosis not present

## 2015-09-03 DIAGNOSIS — I10 Essential (primary) hypertension: Secondary | ICD-10-CM | POA: Diagnosis not present

## 2015-09-19 NOTE — Progress Notes (Signed)
Cardiology Office Note Date:  09/20/2015   ID:  [name], [name] 11/05/1980, MRN [id number]  PCP:  Johny Blamer, MD  Cardiologist:  Tonny Bollman, MD    Chief Complaint  Patient presents with  . Follow-up    ASD s/p Closure     History of Present Illness: [name] is a 35 y.o. female who presents for follow-up of ASD with history of stroke. She underwent transcatheter closure in 2015 using a 30 mm cribriform ASO device.   The patient is doing well. She's had no recurrent stroke/TIA symptoms. She is exercising regularly and following a plant-based diet and she's lost about 20 pounds and says she feels great. Today, she denies symptoms of palpitations, chest pain, shortness of breath, orthopnea, PND, lower extremity edema, dizziness, or syncope.   Past Medical History  Diagnosis Date  . TIA (transient ischemic attack) 02/07/2010  . Stroke [facility]) ?; ?; 02/06/2014    "on 02/06/2014 & MRI showed ~ 2 others that I didn't know of"; denies residual on 02/16/2014  . Anxiety 2013    "after daughter was born I started RX prn"    Past Surgical History  Procedure Laterality Date  . Cesarean section  2003, 2012  . Tee without cardioversion N/A 02/11/2014    Procedure: TRANSESOPHAGEAL ECHOCARDIOGRAM (TEE);  Surgeon: Thurmon Fair, MD;  Location: [facility] ENDOSCOPY;  Service: Cardiovascular;  Laterality: N/A;  . Asd repair  02/16/2014  . Dilation and curettage of uterus    . Asd repair N/A 02/16/2014    Procedure: ATRIAL SEPTAL DEFECT (ASD) REPAIR;  Surgeon: Micheline Chapman, MD;  Location: [facility] CATH LAB;  Service: Cardiovascular;  Laterality: N/A;    Current Outpatient Prescriptions  Medication Sig Dispense Refill  . ALPRAZolam (XANAX) 0.5 MG tablet Take 0.5 mg by mouth 3 (three) times daily as needed for anxiety.     Marland Kitchen aspirin 81 MG tablet Take 81 mg by mouth daily.    . Cyanocobalamin 2500 MCG CHEW Chew 1 tablet by mouth once a week.    . Multiple Vitamin (MULTIVITAMIN WITH  MINERALS) TABS tablet Take 1 tablet by mouth every morning.     . Vitamin D, Ergocalciferol, (DRISDOL) 50000 UNITS CAPS capsule Take 50,000 Units by mouth daily. Taking 5000 iu ddaily     No current facility-administered medications for this visit.    Allergies:   Strawberry extract; Watermelon; Citalopram hydrobromide; Erythromycin; and Penicillins   Social History:  The patient  reports that she has never smoked. She has never used smokeless tobacco. She reports that she does not drink alcohol or use illicit drugs.   Family History:  The patient's  family history includes Healthy in her brother, sister, and sister; Other in her father; Ovarian cancer in her mother.    ROS:  Please see the history of present illness.  All other systems are reviewed and negative.    PHYSICAL EXAM: VS:  BP 126/92 mmHg  Pulse 101  Ht  (1.626 m)  Wt 98.158 kg (216 lb 6.4 oz)  BMI 37.13 kg/m2 , BMI Body mass index is 37.13 kg/(m^2). GEN: Well nourished, well developed, in no acute distress HEENT: normal Neck: no JVD, no masses. No carotid bruits Cardiac: RRR without murmur or gallop                Respiratory:  clear to auscultation bilaterally, normal work of breathing GI: soft, nontender, nondistended, + BS MS: no deformity or atrophy Ext: no pretibial  edema, pedal pulses 2+= bilaterally Skin: warm and dry, no rash Neuro:  Strength and sensation are intact Psych: euthymic mood, full affect  EKG:  EKG is ordered today. The ekg ordered today shows sinus tachycardia, within normal limits otherwise  Recent Labs: No results found for requested labs within last 365 days.   Lipid Panel     Component Value Date/Time   CHOL 128 02/07/2014 0355   TRIG 106 02/07/2014 0355   HDL 39* 02/07/2014 0355   CHOLHDL 3.3 02/07/2014 0355   VLDL 21 02/07/2014 0355   LDLCALC 68 02/07/2014 0355      Wt Readings from Last 3 Encounters:  09/20/15 98.158 kg (216 lb 6.4 oz)  09/03/14 102.876 kg (226 lb  12.8 oz)  03/23/14 100.699 kg (222 lb)     Cardiac Studies Reviewed: 2D Echo 09/03/2015: Study Conclusions  - Left ventricle: The cavity size was normal. Wall thickness was increased in a pattern of mild LVH. Systolic function was normal. The estimated ejection fraction was in the range of 60% to 65%. Wall motion was normal; there were no regional wall motion abnormalities. - Atrial septum: An Amplatzer closure device was present. 30 mm ASO device placed 02/16/14 Positive bubble study, Small amount of bubble cross over from right to left atrium.  Transthoracic echocardiography. M-mode, limited 2D, limited spectral Doppler, and color Doppler. Birthdate: Patient birthdate: 08-21-1980. Age: Patient is 35 yr old. Sex: Gender: female.  BMI: 38.8 kg/m^2. Blood pressure:   118/84 Patient status: Outpatient. Study date: Study date: 09/03/2015. Study time: 10:57 AM. Location: [place] [facility]  -------------------------------------------------------------------  ------------------------------------------------------------------- Left ventricle: The cavity size was normal. Wall thickness was increased in a pattern of mild LVH. Systolic function was normal. The estimated ejection fraction was in the range of 60% to 65%. Wall motion was normal; there were no regional wall motion abnormalities.  ------------------------------------------------------------------- Aortic valve:  Trileaflet; normal thickness leaflets. Mobility was not restricted. Doppler: Transvalvular velocity was within the normal range. There was no stenosis. There was no regurgitation.  ------------------------------------------------------------------- Aorta: Aortic root: The aortic root was normal in size.  ------------------------------------------------------------------- Mitral valve:  Structurally normal valve.  Mobility was not restricted. Doppler: Transvalvular velocity was  within the normal range. There was no evidence for stenosis. There was no regurgitation.  ------------------------------------------------------------------- Left atrium: The atrium was at the upper limits of normal in size.  ------------------------------------------------------------------- Atrial septum: An Amplatzer closure device was present. 30 mm ASO device placed 02/16/14 Positive bubble study, Small amount of bubble cross over from right to left atrium.  ------------------------------------------------------------------- Right ventricle: The cavity size was normal. Wall thickness was normal. Systolic function was normal.  ------------------------------------------------------------------- Pulmonic valve:  Poorly visualized. Structurally normal valve. Cusp separation was normal. Doppler: Transvalvular velocity was within the normal range. There was no evidence for stenosis. There was no regurgitation.  ------------------------------------------------------------------- Tricuspid valve:  Structurally normal valve.  Doppler: Transvalvular velocity was within the normal range. There was no regurgitation.  ------------------------------------------------------------------- Pulmonary artery:  The main pulmonary artery was normal-sized. Systolic pressure was within the normal range.  ------------------------------------------------------------------- Right atrium: The atrium was normal in size.  ------------------------------------------------------------------- Pericardium: There was no pericardial effusion.  ------------------------------------------------------------------- Systemic veins: Inferior vena cava: The vessel was normal in size.  ASSESSMENT AND PLAN: ASD s/p transcatheter closure: the patient is doing well. I've advised her to continue on lifelong ASA 81 mg in the setting of recurrent TIA's prior to closure. I have personally reviewed her echo  study which shows  appropriate position of the ASO device. There is no color-flow across the device and with agitated saline there are very few late bubbles visualized. On the bubble study with Valsalva I do not see any bubbles crossing into the left heart.   In summary I think the patient has an excellent late result from ASD closure and should continue on low-dose ASA therapy. She was encouraged to continue her diet/exercise program. I will see her back in one year for follow-up.   Current medicines are reviewed with the patient today.  The patient does not have concerns regarding medicines.  Labs/ tests ordered today include:  No orders of the defined types were placed in this encounter.    Disposition:   FU one year  Signed, Tonny Bollman, MD  09/20/2015 9:31 AM    [facility] [address] [place], [place], [place]  [address] Phone: [phone]; Fax: [phone]

## 2015-09-20 ENCOUNTER — Ambulatory Visit (INDEPENDENT_AMBULATORY_CARE_PROVIDER_SITE_OTHER): Payer: Managed Care, Other (non HMO) | Admitting: Cardiovascular Disease

## 2015-09-20 ENCOUNTER — Encounter: Payer: Self-pay | Admitting: Cardiovascular Disease

## 2015-09-20 VITALS — BP 126/92 | HR 101 | Ht 64.0 in | Wt 216.4 lb

## 2015-09-20 DIAGNOSIS — Q211 Atrial septal defect: Secondary | ICD-10-CM | POA: Diagnosis not present

## 2015-09-20 DIAGNOSIS — Z8774 Personal history of (corrected) congenital malformations of heart and circulatory system: Secondary | ICD-10-CM

## 2015-09-20 DIAGNOSIS — Q2111 Secundum atrial septal defect: Secondary | ICD-10-CM

## 2015-09-20 DIAGNOSIS — Z9889 Other specified postprocedural states: Secondary | ICD-10-CM | POA: Diagnosis not present

## 2015-09-20 NOTE — Patient Instructions (Signed)

## 2015-10-25 ENCOUNTER — Telehealth: Payer: Self-pay | Admitting: Cardiovascular Disease

## 2015-10-25 NOTE — Telephone Encounter (Signed)
New Message:  Pt called in stating that she will be having 2 crowns put on tomorrow and she wanted to make sure that its cleared. Please f/u with her

## 2015-10-25 NOTE — Telephone Encounter (Signed)
02/16/14 ASD closure  I spoke with the pt and made her aware that she has no restrictions in regards to dental procedures as she is a year and a half out from ASD closure.

## 2017-12-24 DIAGNOSIS — S80261A Insect bite (nonvenomous), right knee, initial encounter: Secondary | ICD-10-CM | POA: Diagnosis not present

## 2017-12-24 DIAGNOSIS — S80262A Insect bite (nonvenomous), left knee, initial encounter: Secondary | ICD-10-CM | POA: Diagnosis not present

## 2017-12-24 DIAGNOSIS — S80862A Insect bite (nonvenomous), left lower leg, initial encounter: Secondary | ICD-10-CM | POA: Diagnosis not present

## 2017-12-24 DIAGNOSIS — S80861A Insect bite (nonvenomous), right lower leg, initial encounter: Secondary | ICD-10-CM | POA: Diagnosis not present

## 2017-12-26 DIAGNOSIS — L309 Dermatitis, unspecified: Secondary | ICD-10-CM | POA: Diagnosis not present

## 2018-06-14 DIAGNOSIS — F419 Anxiety disorder, unspecified: Secondary | ICD-10-CM | POA: Diagnosis not present

## 2018-06-14 DIAGNOSIS — R030 Elevated blood-pressure reading, without diagnosis of hypertension: Secondary | ICD-10-CM | POA: Diagnosis not present

## 2018-06-14 DIAGNOSIS — F324 Major depressive disorder, single episode, in partial remission: Secondary | ICD-10-CM | POA: Diagnosis not present

## 2018-07-29 DIAGNOSIS — J019 Acute sinusitis, unspecified: Secondary | ICD-10-CM | POA: Diagnosis not present

## 2018-07-29 DIAGNOSIS — F419 Anxiety disorder, unspecified: Secondary | ICD-10-CM | POA: Diagnosis not present

## 2018-07-29 DIAGNOSIS — J029 Acute pharyngitis, unspecified: Secondary | ICD-10-CM | POA: Diagnosis not present

## 2018-11-05 DIAGNOSIS — F324 Major depressive disorder, single episode, in partial remission: Secondary | ICD-10-CM | POA: Diagnosis not present

## 2018-11-05 DIAGNOSIS — F419 Anxiety disorder, unspecified: Secondary | ICD-10-CM | POA: Diagnosis not present

## 2019-05-29 DIAGNOSIS — F419 Anxiety disorder, unspecified: Secondary | ICD-10-CM | POA: Diagnosis not present

## 2019-05-29 DIAGNOSIS — F331 Major depressive disorder, recurrent, moderate: Secondary | ICD-10-CM | POA: Diagnosis not present

## 2019-06-06 DIAGNOSIS — Z20828 Contact with and (suspected) exposure to other viral communicable diseases: Secondary | ICD-10-CM | POA: Diagnosis not present

## 2019-09-29 DIAGNOSIS — D649 Anemia, unspecified: Secondary | ICD-10-CM | POA: Diagnosis not present

## 2019-09-29 DIAGNOSIS — E559 Vitamin D deficiency, unspecified: Secondary | ICD-10-CM | POA: Diagnosis not present

## 2019-09-29 DIAGNOSIS — F324 Major depressive disorder, single episode, in partial remission: Secondary | ICD-10-CM | POA: Diagnosis not present

## 2019-09-29 DIAGNOSIS — R5383 Other fatigue: Secondary | ICD-10-CM | POA: Diagnosis not present

## 2019-09-29 DIAGNOSIS — F419 Anxiety disorder, unspecified: Secondary | ICD-10-CM | POA: Diagnosis not present

## 2020-01-07 DIAGNOSIS — Z1231 Encounter for screening mammogram for malignant neoplasm of breast: Secondary | ICD-10-CM | POA: Diagnosis not present

## 2020-01-07 DIAGNOSIS — Z6836 Body mass index (BMI) 36.0-36.9, adult: Secondary | ICD-10-CM | POA: Diagnosis not present

## 2020-01-07 DIAGNOSIS — Z01419 Encounter for gynecological examination (general) (routine) without abnormal findings: Secondary | ICD-10-CM | POA: Diagnosis not present

## 2020-05-12 DIAGNOSIS — F419 Anxiety disorder, unspecified: Secondary | ICD-10-CM | POA: Diagnosis not present

## 2020-05-12 DIAGNOSIS — F331 Major depressive disorder, recurrent, moderate: Secondary | ICD-10-CM | POA: Diagnosis not present

## 2021-01-18 DIAGNOSIS — R5383 Other fatigue: Secondary | ICD-10-CM | POA: Diagnosis not present

## 2021-01-18 DIAGNOSIS — D508 Other iron deficiency anemias: Secondary | ICD-10-CM | POA: Diagnosis not present

## 2021-01-18 DIAGNOSIS — D649 Anemia, unspecified: Secondary | ICD-10-CM | POA: Diagnosis not present

## 2021-01-18 DIAGNOSIS — F324 Major depressive disorder, single episode, in partial remission: Secondary | ICD-10-CM | POA: Diagnosis not present

## 2021-01-18 DIAGNOSIS — E559 Vitamin D deficiency, unspecified: Secondary | ICD-10-CM | POA: Diagnosis not present

## 2021-01-18 DIAGNOSIS — F419 Anxiety disorder, unspecified: Secondary | ICD-10-CM | POA: Diagnosis not present

## 2021-02-12 DIAGNOSIS — J020 Streptococcal pharyngitis: Secondary | ICD-10-CM | POA: Diagnosis not present

## 2021-08-09 DIAGNOSIS — F418 Other specified anxiety disorders: Secondary | ICD-10-CM | POA: Diagnosis not present

## 2021-08-16 DIAGNOSIS — F411 Generalized anxiety disorder: Secondary | ICD-10-CM | POA: Diagnosis not present

## 2021-08-23 DIAGNOSIS — F418 Other specified anxiety disorders: Secondary | ICD-10-CM | POA: Diagnosis not present

## 2021-09-06 DIAGNOSIS — F418 Other specified anxiety disorders: Secondary | ICD-10-CM | POA: Diagnosis not present

## 2021-09-13 DIAGNOSIS — B349 Viral infection, unspecified: Secondary | ICD-10-CM | POA: Diagnosis not present

## 2021-09-13 DIAGNOSIS — R059 Cough, unspecified: Secondary | ICD-10-CM | POA: Diagnosis not present

## 2021-09-13 DIAGNOSIS — J029 Acute pharyngitis, unspecified: Secondary | ICD-10-CM | POA: Diagnosis not present

## 2021-09-13 DIAGNOSIS — Z03818 Encounter for observation for suspected exposure to other biological agents ruled out: Secondary | ICD-10-CM | POA: Diagnosis not present

## 2021-09-20 DIAGNOSIS — F418 Other specified anxiety disorders: Secondary | ICD-10-CM | POA: Diagnosis not present

## 2021-09-27 DIAGNOSIS — F418 Other specified anxiety disorders: Secondary | ICD-10-CM | POA: Diagnosis not present

## 2021-10-06 DIAGNOSIS — F331 Major depressive disorder, recurrent, moderate: Secondary | ICD-10-CM | POA: Diagnosis not present

## 2021-10-14 DIAGNOSIS — F331 Major depressive disorder, recurrent, moderate: Secondary | ICD-10-CM | POA: Diagnosis not present

## 2021-10-21 DIAGNOSIS — F331 Major depressive disorder, recurrent, moderate: Secondary | ICD-10-CM | POA: Diagnosis not present

## 2021-10-28 DIAGNOSIS — F331 Major depressive disorder, recurrent, moderate: Secondary | ICD-10-CM | POA: Diagnosis not present

## 2021-11-02 DIAGNOSIS — F331 Major depressive disorder, recurrent, moderate: Secondary | ICD-10-CM | POA: Diagnosis not present

## 2021-11-11 DIAGNOSIS — F331 Major depressive disorder, recurrent, moderate: Secondary | ICD-10-CM | POA: Diagnosis not present

## 2021-11-21 DIAGNOSIS — F331 Major depressive disorder, recurrent, moderate: Secondary | ICD-10-CM | POA: Diagnosis not present

## 2021-12-02 DIAGNOSIS — F331 Major depressive disorder, recurrent, moderate: Secondary | ICD-10-CM | POA: Diagnosis not present

## 2022-04-20 DIAGNOSIS — F419 Anxiety disorder, unspecified: Secondary | ICD-10-CM | POA: Diagnosis not present

## 2022-04-20 DIAGNOSIS — F909 Attention-deficit hyperactivity disorder, unspecified type: Secondary | ICD-10-CM | POA: Diagnosis not present

## 2022-04-20 DIAGNOSIS — F331 Major depressive disorder, recurrent, moderate: Secondary | ICD-10-CM | POA: Diagnosis not present
